# Patient Record
Sex: Female | Born: 1965
Health system: Southern US, Community
[De-identification: ages and names within clinical notes are randomized; demographics above are authoritative.]

## PROBLEM LIST (undated history)

## (undated) HISTORY — PX: TUBAL LIGATION: SHX77

## (undated) HISTORY — PX: ENDOMETRIAL ABLATION: SHX621

---

## 1999-08-26 ENCOUNTER — Other Ambulatory Visit: Admission: RE | Admit: 1999-08-26 | Discharge: 1999-08-26 | Payer: Self-pay | Admitting: Obstetrics and Gynecology

## 2001-11-20 ENCOUNTER — Other Ambulatory Visit: Admission: RE | Admit: 2001-11-20 | Discharge: 2001-11-20 | Payer: Self-pay | Admitting: Obstetrics and Gynecology

## 2002-11-15 ENCOUNTER — Other Ambulatory Visit: Admission: RE | Admit: 2002-11-15 | Discharge: 2002-11-15 | Payer: Self-pay | Admitting: Obstetrics and Gynecology

## 2006-01-06 ENCOUNTER — Other Ambulatory Visit: Admission: RE | Admit: 2006-01-06 | Discharge: 2006-01-06 | Payer: Self-pay | Admitting: Obstetrics and Gynecology

## 2006-02-04 ENCOUNTER — Encounter: Admission: RE | Admit: 2006-02-04 | Discharge: 2006-02-04 | Payer: Self-pay | Admitting: Obstetrics and Gynecology

## 2006-02-24 ENCOUNTER — Encounter: Admission: RE | Admit: 2006-02-24 | Discharge: 2006-02-24 | Payer: Self-pay | Admitting: Obstetrics and Gynecology

## 2007-05-04 ENCOUNTER — Encounter: Admission: RE | Admit: 2007-05-04 | Discharge: 2007-05-04 | Payer: Self-pay | Admitting: Obstetrics and Gynecology

## 2007-05-12 ENCOUNTER — Encounter: Admission: RE | Admit: 2007-05-12 | Discharge: 2007-05-12 | Payer: Self-pay | Admitting: Obstetrics and Gynecology

## 2008-11-11 ENCOUNTER — Other Ambulatory Visit: Admission: RE | Admit: 2008-11-11 | Discharge: 2008-11-11 | Payer: Self-pay | Admitting: Obstetrics and Gynecology

## 2008-11-18 ENCOUNTER — Encounter: Admission: RE | Admit: 2008-11-18 | Discharge: 2008-11-18 | Payer: Self-pay | Admitting: Obstetrics and Gynecology

## 2010-04-28 ENCOUNTER — Encounter: Admission: RE | Admit: 2010-04-28 | Discharge: 2010-04-28 | Payer: Self-pay | Admitting: Obstetrics and Gynecology

## 2011-04-28 ENCOUNTER — Other Ambulatory Visit: Payer: Self-pay | Admitting: Obstetrics and Gynecology

## 2011-04-28 DIAGNOSIS — Z1231 Encounter for screening mammogram for malignant neoplasm of breast: Secondary | ICD-10-CM

## 2011-05-13 ENCOUNTER — Ambulatory Visit
Admission: RE | Admit: 2011-05-13 | Discharge: 2011-05-13 | Disposition: A | Discharge status: Home / Self Care | Payer: BC Managed Care – PPO | Source: Ambulatory Visit | Attending: Obstetrics and Gynecology | Admitting: Obstetrics and Gynecology

## 2011-05-13 DIAGNOSIS — Z1231 Encounter for screening mammogram for malignant neoplasm of breast: Secondary | ICD-10-CM

## 2015-02-04 ENCOUNTER — Other Ambulatory Visit: Payer: Self-pay

## 2015-02-04 DIAGNOSIS — Z1231 Encounter for screening mammogram for malignant neoplasm of breast: Secondary | ICD-10-CM

## 2015-02-18 ENCOUNTER — Ambulatory Visit
Admission: RE | Admit: 2015-02-18 | Discharge: 2015-02-18 | Disposition: A | Discharge status: Home / Self Care | Payer: BLUE CROSS/BLUE SHIELD | Source: Ambulatory Visit

## 2015-02-18 DIAGNOSIS — Z1231 Encounter for screening mammogram for malignant neoplasm of breast: Secondary | ICD-10-CM

## 2015-02-19 ENCOUNTER — Other Ambulatory Visit: Payer: Self-pay | Admitting: Nurse Practitioner

## 2015-02-19 DIAGNOSIS — R928 Other abnormal and inconclusive findings on diagnostic imaging of breast: Secondary | ICD-10-CM

## 2015-02-21 ENCOUNTER — Ambulatory Visit
Admission: RE | Admit: 2015-02-21 | Discharge: 2015-02-21 | Disposition: A | Discharge status: Home / Self Care | Payer: BLUE CROSS/BLUE SHIELD | Source: Ambulatory Visit | Attending: Nurse Practitioner | Admitting: Nurse Practitioner

## 2015-02-21 DIAGNOSIS — R928 Other abnormal and inconclusive findings on diagnostic imaging of breast: Secondary | ICD-10-CM

## 2015-02-26 ENCOUNTER — Other Ambulatory Visit: Payer: BLUE CROSS/BLUE SHIELD

## 2015-07-29 ENCOUNTER — Ambulatory Visit (INDEPENDENT_AMBULATORY_CARE_PROVIDER_SITE_OTHER): Payer: BLUE CROSS/BLUE SHIELD | Admitting: Certified Nurse Midwife

## 2015-07-29 ENCOUNTER — Encounter: Payer: Self-pay | Admitting: Certified Nurse Midwife

## 2015-07-29 VITALS — BP 110/78 | HR 70 | Resp 16 | Ht 66.5 in | Wt 141.0 lb

## 2015-07-29 DIAGNOSIS — Z01419 Encounter for gynecological examination (general) (routine) without abnormal findings: Secondary | ICD-10-CM | POA: Diagnosis not present

## 2015-07-29 DIAGNOSIS — Z124 Encounter for screening for malignant neoplasm of cervix: Secondary | ICD-10-CM

## 2015-07-29 DIAGNOSIS — Z23 Encounter for immunization: Secondary | ICD-10-CM | POA: Diagnosis not present

## 2015-07-29 DIAGNOSIS — Z1211 Encounter for screening for malignant neoplasm of colon: Secondary | ICD-10-CM | POA: Diagnosis not present

## 2015-07-29 DIAGNOSIS — Z0000 Encounter for general adult medical examination without abnormal findings: Secondary | ICD-10-CM

## 2015-07-29 LAB — POCT URINALYSIS DIPSTICK
Bilirubin, UA: NEGATIVE
Blood, UA: NEGATIVE
Glucose, UA: NEGATIVE
Ketones, UA: NEGATIVE
Leukocytes, UA: NEGATIVE
Nitrite, UA: NEGATIVE
Protein, UA: NEGATIVE
Urobilinogen, UA: NEGATIVE
pH, UA: 5

## 2015-07-29 NOTE — Patient Instructions (Signed)
EXERCISE AND DIET:  We recommended that you start or continue a regular exercise program for good health. Regular exercise means any activity that makes your heart beat faster and makes you sweat.  We recommend exercising at least 30 minutes per day at least 3 days a week, preferably 4 or 5.  We also recommend a diet low in fat and sugar.  Inactivity, poor dietary choices and obesity can cause diabetes, heart attack, stroke, and kidney damage, among others.    ALCOHOL AND SMOKING:  Women should limit their alcohol intake to no more than 7 drinks/beers/glasses of wine (combined, not each!) per week. Moderation of alcohol intake to this level decreases your risk of breast cancer and liver damage. And of course, no recreational drugs are part of a healthy lifestyle.  And absolutely no smoking or even second hand smoke. Most people know smoking can cause heart and lung diseases, but did you know it also contributes to weakening of your bones? Aging of your skin?  Yellowing of your teeth and nails?  CALCIUM AND VITAMIN D:  Adequate intake of calcium and Vitamin D are recommended.  The recommendations for exact amounts of these supplements seem to change often, but generally speaking 600 mg of calcium (either carbonate or citrate) and 800 units of Vitamin D per day seems prudent. Certain women may benefit from higher intake of Vitamin D.  If you are among these women, your doctor will have told you during your visit.    PAP SMEARS:  Pap smears, to check for cervical cancer or precancers,  have traditionally been done yearly, although recent scientific advances have shown that most women can have pap smears less often.  However, every woman still should have a physical exam from her gynecologist every year. It will include a breast check, inspection of the vulva and vagina to check for abnormal growths or skin changes, a visual exam of the cervix, and then an exam to evaluate the size and shape of the uterus and  ovaries.  And after 51 years of age, a rectal exam is indicated to check for rectal cancers. We will also provide age appropriate advice regarding health maintenance, like when you should have certain vaccines, screening for sexually transmitted diseases, bone density testing, colonoscopy, mammograms, etc.   MAMMOGRAMS:  All women over 40 years old should have a yearly mammogram. Many facilities now offer a "3D" mammogram, which may cost around $50 extra out of pocket. If possible,  we recommend you accept the option to have the 3D mammogram performed.  It both reduces the number of women who will be called back for extra views which then turn out to be normal, and it is better than the routine mammogram at detecting truly abnormal areas.    COLONOSCOPY:  Colonoscopy to screen for colon cancer is recommended for all women at age 50.  We know, you hate the idea of the prep.  We agree, BUT, having colon cancer and not knowing it is worse!!  Colon cancer so often starts as a polyp that can be seen and removed at colonscopy, which can quite literally save your life!  And if your first colonoscopy is normal and you have no family history of colon cancer, most women don't have to have it again for 10 years.  Once every ten years, you can do something that may end up saving your life, right?  We will be happy to help you get it scheduled when you are ready.    Be sure to check your insurance coverage so you understand how much it will cost.  It may be covered as a preventative service at no cost, but you should check your particular policy.     Leg Cramps Leg cramps that occur during exercise can be caused by poor circulation or dehydration. However, muscle cramps that occur at rest or during the night are usually not due to any serious medical problem. Heat cramps may cause muscle spasms during hot weather.  CAUSES There is no clear cause for muscle cramps. However, dehydration may be a factor for those who do not  drink enough fluids and those who exercise in the heat. Imbalances in the level of sodium, potassium, calcium or magnesium in the muscle tissue may also be a factor. Some medications, such as water pills (diuretics), may cause loss of chemicals that the body needs (like sodium and potassium) and cause muscle cramps. TREATMENT   Make sure your diet has enough fluids and essential minerals for the muscle to work normally.  Avoid strenuous exercise for several days if you have been having frequent leg cramps.  Stretch and massage the cramped muscle for several minutes.  Some medicines may be helpful in some patients with night cramps. Only take over-the-counter or prescription medicines as directed by your caregiver. SEEK IMMEDIATE MEDICAL CARE IF:   Your leg cramps become worse.  Your foot becomes cold, numb, or blue. Document Released: 01/20/2005 Document Revised: 03/06/2012 Document Reviewed: 01/07/2009 [facility] Patient Information 2015 [place], [place]. This information is not intended to replace advice given to you by your health care provider. Make sure you discuss any questions you have with your health care provider.  Massage, heat to area or socks.   Great to meet you today. Have a great summer. Debbi

## 2015-07-29 NOTE — Progress Notes (Signed)
49 y.o. G31P2004 Divorced  Caucasian Fe here to re-establish care and  for annual exam.  No menses due to ablation for menorrhagia. Denies hot flashes, night sweats or concerns. No STD screening needed.Patient is using Urgent care if needed. Patient complaining of cramping of feet and toes at hs, sometimes last longer, usually resolves with walking or massage or rest. Does wear heels all day and very little calcium in diet. No history of leg or circulation issues. Desires screening labs today.  No LMP recorded. Patient has had an ablation.  LMP 2007       Sexually active: Yes.    The current method of family planning is tubal ligation.    Exercising: No.  exercise Smoker:  no  Health Maintenance: Pap:  Yrs ago, no abnormal pap smears MMG: 02-18-15 category c,, 02-21-15 birads 2:neg. Recheck of right breast cyst, has decreased in size. Colonoscopy:  none BMD:   none TDaP:  unsure Labs: poct urine-neg Self breast exam: done monthly   reports that she has been smoking.  She does not have any smokeless tobacco history on file. She reports that she drinks about 4.2 oz of alcohol per week. She reports that she does not use illicit drugs.  History reviewed. No pertinent past medical history.  Past Surgical History  Procedure Laterality Date  . Endometrial ablation    . Tubal ligation      No current outpatient prescriptions on file.   No current facility-administered medications for this visit.    Family History  Problem Relation Age of Onset  . Breast cancer Mother   . Heart attack Father   . Heart attack Maternal Grandmother   . Breast cancer Paternal Grandmother   . Diabetes Paternal Grandmother     ROS:  Pertinent items are noted in HPI.  Otherwise, a comprehensive ROS was negative.  Exam:   BP 110/78 mmHg  Pulse 70  Resp 16  Ht 5' 6.5" (1.689 m)  Wt 141 lb (63.957 kg)  BMI 22.42 kg/m2 Height: 5' 6.5" (168.9 cm) Ht Readings from Last 3 Encounters:  07/29/15 5' 6.5" (1.689  m)    General appearance: alert, cooperative and appears stated age Head: Normocephalic, without obvious abnormality, atraumatic Neck: no adenopathy, supple, symmetrical, trachea midline and thyroid normal to inspection and palpation Lungs: clear to auscultation bilaterally Breasts: normal appearance, no masses or tenderness, No nipple retraction or dimpling, No nipple discharge or bleeding, No axillary or supraclavicular adenopathy Heart: regular rate and rhythm Abdomen: soft, non-tender; no masses,  no organomegaly Extremities: extremities normal, atraumatic, no cyanosis or edema Skin: Skin color, texture, turgor normal. No rashes or lesions Lymph nodes: Cervical, supraclavicular, and axillary nodes normal. No abnormal inguinal nodes palpated Neurologic: Grossly normal   Pelvic: External genitalia:  no lesions              Urethra:  normal appearing urethra with no masses, tenderness or lesions              Bartholin's and Skene's: normal                 Vagina: normal appearing vagina with normal color and discharge, no lesions              Cervix: normal,non tender,no lesions              Pap taken: Yes.   Bimanual Exam:  Uterus:  normal size, contour, position, consistency, mobility, non-tender  Adnexa: normal adnexa and no mass, fullness, tenderness               Rectovaginal: Confirms               Anus:  normal sphincter tone, no lesions  Chaperone present: Yes  A:  Well Woman with normal exam  Contraception BTL  Amenorrhea due to ablation history for menorrhagia  Family history of Breast cancer mother(50's) ? Genetic screening done, and paternal grandmother( older age onset)  Recent mammogram for follow up of cyst which has decreased in size and now on yearly follow up  Leg and feet cramping  Screening labs  Colonoscopy due  Immunization due  P:   Reviewed health and wellness pertinent to exam  Discussed perimenopausal changes they may occur at her age  and etiology. Expectations discussed and need to advise if concerns  Discussed genetic screening option with family history of breast cancer. Patient feels mother had this done. Will check with her and advise. Stressed SBE and mammogram yearly with 3D screening.  Discussed calcium and magnesium balance which can cause cramping. Heels with continued pressure on ball of foot can also cause this. Work on diet if deficient. Try socks at hs also. Handout given. Advise if not resolving.  Labs: CBC, CMP, Lipid panel, TSH, Vitamin D  Discussed risks and benefits of colonoscopy. Desires referral for. Patient aware she will be called with appointment information.  Requests TDAP  Pap smear taken with HPVHR   counseled on breast self exam, mammography screening, adequate intake of calcium and vitamin D, diet and exercise,   return annually or prn  An After Visit Summary was printed and given to the patient.

## 2015-07-30 ENCOUNTER — Telehealth: Payer: Self-pay | Admitting: Certified Nurse Midwife

## 2015-07-30 LAB — COMPREHENSIVE METABOLIC PANEL
ALT: 21 U/L (ref 6–29)
AST: 19 U/L (ref 10–35)
Albumin: 4.2 g/dL (ref 3.6–5.1)
Alkaline Phosphatase: 45 U/L (ref 33–130)
BUN: 20 mg/dL (ref 7–25)
CO2: 28 mmol/L (ref 20–31)
Calcium: 9.7 mg/dL (ref 8.6–10.4)
Chloride: 101 mmol/L (ref 98–110)
Creat: 0.86 mg/dL (ref 0.50–1.05)
GLUCOSE: 51 mg/dL — AB (ref 65–99)
Potassium: 4.3 mmol/L (ref 3.5–5.3)
SODIUM: 141 mmol/L (ref 135–146)
TOTAL PROTEIN: 7.2 g/dL (ref 6.1–8.1)
Total Bilirubin: 0.5 mg/dL (ref 0.2–1.2)

## 2015-07-30 LAB — CBC
HEMATOCRIT: 45.2 % (ref 36.0–46.0)
HEMOGLOBIN: 15 g/dL (ref 12.0–15.0)
MCH: 32 pg (ref 26.0–34.0)
MCHC: 33.2 g/dL (ref 30.0–36.0)
MCV: 96.4 fL (ref 78.0–100.0)
MPV: 11 fL (ref 8.6–12.4)
PLATELETS: 266 10*3/uL (ref 150–400)
RBC: 4.69 MIL/uL (ref 3.87–5.11)
RDW: 13.5 % (ref 11.5–15.5)
WBC: 9.1 10*3/uL (ref 4.0–10.5)

## 2015-07-30 LAB — VITAMIN D 25 HYDROXY (VIT D DEFICIENCY, FRACTURES): VIT D 25 HYDROXY: 32 ng/mL (ref 30–100)

## 2015-07-30 LAB — LIPID PANEL
Cholesterol: 216 mg/dL — ABNORMAL HIGH (ref 125–200)
HDL: 108 mg/dL (ref >=46)
LDL CALC: 95 mg/dL (ref <130)
Total CHOL/HDL Ratio: 2 Ratio (ref <=5.0)
Triglycerides: 66 mg/dL (ref <150)
VLDL: 13 mg/dL (ref <30)

## 2015-07-30 LAB — TSH: TSH: 3.471 u[IU]/mL (ref 0.350–4.500)

## 2015-07-30 NOTE — Progress Notes (Signed)
Reviewed personally.  M. Suzanne Jeanell Mangan, MD.  

## 2015-07-30 NOTE — Telephone Encounter (Signed)
Notes Recorded by Eliezer Bottom, CMA on 07/30/2015 at 10:20 AM Message giving results left on patients voicemail. Release signed [phone] (Mobile) Notes Recorded by Verner Chol, CNM on 07/30/2015 at 7:57 AM Notify Vitamin D, TSH, CBC are Normal Lipid panel all areas normal just borderline cholesterol only HDL is great Liver,kidney profile is normal, glucose low, but were fasting. Be sure and eat breakfast daily!   Left message to call Evo Aderman at [phone].

## 2015-07-30 NOTE — Telephone Encounter (Signed)
Patient is asking for recent labs results. Patient says she received a message earlier today but could not understand. No open tc note. Last seen 07/29/15.

## 2015-07-31 LAB — IPS PAP TEST WITH HPV

## 2015-08-04 NOTE — Telephone Encounter (Signed)
Late entry.Spoke with patient on 07/30/2015. Results given as seen below. Patient agreeable and verbalized understanding.  Routing to provider for final review. Patient agreeable to disposition. Will close encounter.   Patient aware provider will review message and nurse will return call if any additional advice or change of disposition.

## 2016-06-16 ENCOUNTER — Other Ambulatory Visit: Payer: Self-pay | Admitting: Certified Nurse Midwife

## 2016-06-16 DIAGNOSIS — Z1231 Encounter for screening mammogram for malignant neoplasm of breast: Secondary | ICD-10-CM

## 2016-06-25 ENCOUNTER — Ambulatory Visit
Admission: RE | Admit: 2016-06-25 | Discharge: 2016-06-25 | Disposition: A | Discharge status: Home / Self Care | Payer: BLUE CROSS/BLUE SHIELD | Source: Ambulatory Visit | Attending: Certified Nurse Midwife | Admitting: Certified Nurse Midwife

## 2016-06-25 DIAGNOSIS — Z1231 Encounter for screening mammogram for malignant neoplasm of breast: Secondary | ICD-10-CM

## 2016-08-03 ENCOUNTER — Ambulatory Visit: Payer: BLUE CROSS/BLUE SHIELD | Admitting: Certified Nurse Midwife

## 2016-09-03 ENCOUNTER — Ambulatory Visit: Payer: BLUE CROSS/BLUE SHIELD | Admitting: Certified Nurse Midwife

## 2016-09-09 ENCOUNTER — Ambulatory Visit (INDEPENDENT_AMBULATORY_CARE_PROVIDER_SITE_OTHER): Payer: BLUE CROSS/BLUE SHIELD | Admitting: Certified Nurse Midwife

## 2016-09-09 ENCOUNTER — Encounter: Payer: Self-pay | Admitting: Certified Nurse Midwife

## 2016-09-09 VITALS — BP 100/68 | HR 70 | Resp 16 | Ht 66.5 in | Wt 133.0 lb

## 2016-09-09 DIAGNOSIS — Z01419 Encounter for gynecological examination (general) (routine) without abnormal findings: Secondary | ICD-10-CM

## 2016-09-09 DIAGNOSIS — Z0000 Encounter for general adult medical examination without abnormal findings: Secondary | ICD-10-CM | POA: Diagnosis not present

## 2016-09-09 DIAGNOSIS — R6882 Decreased libido: Secondary | ICD-10-CM

## 2016-09-09 LAB — LIPID PANEL
CHOLESTEROL: 202 mg/dL — AB (ref 125–200)
HDL: 100 mg/dL (ref >=46)
LDL Cholesterol: 91 mg/dL (ref <130)
Total CHOL/HDL Ratio: 2 Ratio (ref <=5.0)
Triglycerides: 54 mg/dL (ref <150)
VLDL: 11 mg/dL (ref <30)

## 2016-09-09 LAB — TSH: TSH: 3.43 m[IU]/L

## 2016-09-09 NOTE — Progress Notes (Signed)
50 y.o. [id number] Divorced  Caucasian Fe here for annual exam. Denies vaginal bleeding with ablation. Denies hot flashes and night sweats. Has noted change in libido and has decreased. She feels some of this could be fatigue. Partner is 46 and concerned. Denies vaginal dryness or pain with intercourse. No vaginal changes that she is aware of . Sees Urgent care if needed. Screening labs if needed. No health issues today[place]  No LMP recorded. Patient has had an ablation.          Sexually active: Yes.    The current method of family planning is tubal ligation.    Exercising: No.  exercise Smoker:  no  Health Maintenance: Pap:  07-29-15 neg HPV HR neg MMG:  06-25-16 category c density birads 1:neg Colonoscopy:  9/16 neg f/u 5761yrs BMD:   none TDaP:  2016 Shingles: no Pneumonia: no Hep C and HIV: not done Labs: none Self breast exam: done in shower   reports that she has quit smoking. She has never used smokeless tobacco. She reports that she drinks about 2.4 - 3.0 oz of alcohol per week . She reports that she does not use drugs.  History reviewed. No pertinent past medical history.  Past Surgical History:  Procedure Laterality Date  . ENDOMETRIAL ABLATION    . TUBAL LIGATION      No current outpatient prescriptions on file.   No current facility-administered medications for this visit.     Family History  Problem Relation Age of Onset  . Breast cancer Mother   . Heart attack Father   . Heart attack Maternal Grandmother   . Breast cancer Paternal Grandmother   . Diabetes Paternal Grandmother   . Heart attack Paternal Grandfather     ROS:  Pertinent items are noted in HPI.  Otherwise, a comprehensive ROS was negative.  Exam:   BP 100/68   Pulse 70   Resp 16   Ht 5' 6.5" (1.689 m)   Wt 133 lb (60.3 kg)   BMI 21.15 kg/m  Height: 5' 6.5" (168.9 cm) Ht Readings from Last 3 Encounters:  09/09/16 5' 6.5" (1.689 m)  07/29/15 5' 6.5" (1.689 m)    General  appearance: alert, cooperative and appears stated age Head: Normocephalic, without obvious abnormality, atraumatic Neck: no adenopathy, supple, symmetrical, trachea midline and thyroid normal to inspection and palpation Lungs: clear to auscultation bilaterally Breasts: normal appearance, no masses or tenderness, No nipple retraction or dimpling, No nipple discharge or bleeding, No axillary or supraclavicular adenopathy Heart: regular rate and rhythm Abdomen: soft, non-tender; no masses,  no organomegaly Extremities: extremities normal, atraumatic, no cyanosis or edema Skin: Skin color, texture, turgor normal. No rashes or lesions Lymph nodes: Cervical, supraclavicular, and axillary nodes normal. No abnormal inguinal nodes palpated Neurologic: Grossly normal   Pelvic: External genitalia:  no lesions              Urethra:  normal appearing urethra with no masses, tenderness or lesions              Bartholin's and Skene's: normal                 Vagina: normal appearing vagina with normal color and discharge, no lesions              Cervix: no cervical motion tenderness and no lesions              Pap taken: No. Bimanual Exam:  Uterus:  normal size, contour, position, consistency, mobility, non-tender              Adnexa: normal adnexa and no mass, fullness, tenderness               Rectovaginal: Confirms               Anus:  normal sphincter tone, no lesions  Chaperone present: yes  A:  Well Woman with normal exam  Perimenopausal with decrease libido  Screening labs  P:   Reviewed health and wellness pertinent to exam  Discussed perimenopause/menopause and etiology, changes and expectations. Discussed libido change with age and hormonal age at times, no physical changes noted on exam. Will also do labs and advise. Questions addressed. Handout given . Discussed planning date night to help with time together for sexual activity.   Labs Lipid panel, TSH,FSH,Vitamin D, testosterone  Pap  smear as above not taken   counseled on breast self exam, mammography screening, adequate intake of calcium and vitamin D, diet and exercise  return annually or prn  An After Visit Summary was printed and given to the patient.

## 2016-09-09 NOTE — Patient Instructions (Signed)

## 2016-09-10 ENCOUNTER — Telehealth: Payer: Self-pay | Admitting: Certified Nurse Midwife

## 2016-09-10 LAB — FOLLICLE STIMULATING HORMONE: FSH: 127 m[IU]/mL — AB

## 2016-09-10 LAB — VITAMIN D 25 HYDROXY (VIT D DEFICIENCY, FRACTURES): Vit D, 25-Hydroxy: 36 ng/mL (ref 30–100)

## 2016-09-10 LAB — TESTOSTERONE, TOTAL, LC/MS/MS: TESTOSTERONE, TOTAL, LC-MS-MS: 25 ng/dL (ref 2–45)

## 2016-09-10 NOTE — Telephone Encounter (Signed)
Opened In error

## 2016-09-12 NOTE — Progress Notes (Signed)
Encounter reviewed Jill Jertson, MD   

## 2016-09-21 ENCOUNTER — Ambulatory Visit (INDEPENDENT_AMBULATORY_CARE_PROVIDER_SITE_OTHER): Payer: BLUE CROSS/BLUE SHIELD | Admitting: Certified Nurse Midwife

## 2016-09-21 ENCOUNTER — Encounter: Payer: Self-pay | Admitting: Certified Nurse Midwife

## 2016-09-21 VITALS — BP 116/72 | HR 76 | Resp 16 | Ht 67.0 in | Wt 133.4 lb

## 2016-09-21 DIAGNOSIS — N951 Menopausal and female climacteric states: Secondary | ICD-10-CM

## 2016-09-21 NOTE — Patient Instructions (Signed)

## 2016-09-21 NOTE — Progress Notes (Addendum)
50 y.o.  [id number] Divorced Caucasian female here to discuss intitation of HRT.  LMP: No LMP recorded. Patient has had an ablation..  Patient reports she's having moodiness, no energy. These have been present for 2 months. Some decrease in libido but she feels related to working late hours and having partner's daughter there every weekend. "Just no time for us" Partner is also younger than patient.  Has read about HRT with menopausal symptoms, but not really having hot flashes or night sweats and no insomnia.   History reviewed. No pertinent past medical history.  Heart disease:  No. DVT history:  No. Breast Cancer:  No.  (mother had breast cancer after menopause)  Last Mammogram: 06/30/16 negative  Last AEX:  09/09/16 Last Pap smear:  07/29/15 negative  The patient has a family history of heart attacks and breast cancer.  Discussed with patient risks and benefits and specifically the WHI study including but not limited to risks of increased risks of heart disease, MI, stroke, DVT, and breast cancer.  Increased risks of gall bladder disease and change in cholesterol panels also discussed.  Possibility of bleeding was discussed as patient does have a uterus.  Benefits of improved quality of life, improved bone density and decreased risks of colon cancer also discussed.    Recent lab work:  FSH: 127.0        TSH: 3.43         AMH:  contraception BTL  Assessment:  Symptomatic menopausal symptoms only mood changes and some fatigue. ? Related to social situation.   Plan:  Discussed with patients her concerns and suggestions given for increasing libido. Discussed being open with partner about time of day for time together and change with daughter visits possible.  Patient does not want to start on HRT at this point to her family history of breast cancer. Given handout on menopause and comfort measures if other menopausal symptoms occur. Questions addressed at length. Patient will advise if concerns.  28   minutes spent with patient >50% of time was in face to face discussion of menopause and management of symptoms.

## 2016-09-24 NOTE — Progress Notes (Signed)
Encounter reviewed [name], MD   

## 2016-09-26 NOTE — Addendum Note (Signed)
Addended by: Verner CholLEONARD, DEBORAH S on: 09/26/2016 01:37 PM   Modules accepted: Orders

## 2017-09-13 ENCOUNTER — Ambulatory Visit: Payer: BLUE CROSS/BLUE SHIELD | Admitting: Certified Nurse Midwife

## 2017-09-19 ENCOUNTER — Other Ambulatory Visit: Payer: Self-pay | Admitting: Certified Nurse Midwife

## 2017-09-19 DIAGNOSIS — Z1231 Encounter for screening mammogram for malignant neoplasm of breast: Secondary | ICD-10-CM

## 2017-10-03 ENCOUNTER — Ambulatory Visit
Admission: RE | Admit: 2017-10-03 | Discharge: 2017-10-03 | Disposition: A | Discharge status: Home / Self Care | Payer: BLUE CROSS/BLUE SHIELD | Source: Ambulatory Visit | Attending: Certified Nurse Midwife | Admitting: Certified Nurse Midwife

## 2017-10-03 DIAGNOSIS — Z1231 Encounter for screening mammogram for malignant neoplasm of breast: Secondary | ICD-10-CM

## 2017-10-06 ENCOUNTER — Ambulatory Visit: Payer: BLUE CROSS/BLUE SHIELD | Admitting: Certified Nurse Midwife

## 2017-11-15 ENCOUNTER — Ambulatory Visit: Payer: BLUE CROSS/BLUE SHIELD | Admitting: Certified Nurse Midwife

## 2018-01-10 ENCOUNTER — Other Ambulatory Visit: Payer: Self-pay

## 2018-01-10 ENCOUNTER — Other Ambulatory Visit (HOSPITAL_COMMUNITY)
Admission: RE | Admit: 2018-01-10 | Discharge: 2018-01-10 | Disposition: A | Discharge status: Home / Self Care | Payer: BLUE CROSS/BLUE SHIELD | Source: Ambulatory Visit | Attending: Obstetrics & Gynecology | Admitting: Obstetrics & Gynecology

## 2018-01-10 ENCOUNTER — Encounter: Payer: Self-pay | Admitting: Certified Nurse Midwife

## 2018-01-10 ENCOUNTER — Ambulatory Visit (INDEPENDENT_AMBULATORY_CARE_PROVIDER_SITE_OTHER): Payer: BLUE CROSS/BLUE SHIELD | Admitting: Certified Nurse Midwife

## 2018-01-10 VITALS — BP 102/68 | HR 58 | Resp 14 | Ht 66.5 in | Wt 150.5 lb

## 2018-01-10 DIAGNOSIS — Z124 Encounter for screening for malignant neoplasm of cervix: Secondary | ICD-10-CM

## 2018-01-10 DIAGNOSIS — N6310 Unspecified lump in the right breast, unspecified quadrant: Secondary | ICD-10-CM

## 2018-01-10 DIAGNOSIS — N951 Menopausal and female climacteric states: Secondary | ICD-10-CM

## 2018-01-10 DIAGNOSIS — Z01419 Encounter for gynecological examination (general) (routine) without abnormal findings: Secondary | ICD-10-CM | POA: Diagnosis not present

## 2018-01-10 DIAGNOSIS — Z0000 Encounter for general adult medical examination without abnormal findings: Secondary | ICD-10-CM | POA: Diagnosis not present

## 2018-01-10 NOTE — Patient Instructions (Signed)
EXERCISE AND DIET:  We recommended that you start or continue a regular exercise program for good health. Regular exercise means any activity that makes your heart beat faster and makes you sweat.  We recommend exercising at least 30 minutes per day at least 3 days a week, preferably 4 or 5.  We also recommend a diet low in fat and sugar.  Inactivity, poor dietary choices and obesity can cause diabetes, heart attack, stroke, and kidney damage, among others.    ALCOHOL AND SMOKING:  Women should limit their alcohol intake to no more than 7 drinks/beers/glasses of wine (combined, not each!) per week. Moderation of alcohol intake to this level decreases your risk of breast cancer and liver damage. And of course, no recreational drugs are part of a healthy lifestyle.  And absolutely no smoking or even second hand smoke. Most people know smoking can cause heart and lung diseases, but did you know it also contributes to weakening of your bones? Aging of your skin?  Yellowing of your teeth and nails?  CALCIUM AND VITAMIN D:  Adequate intake of calcium and Vitamin D are recommended.  The recommendations for exact amounts of these supplements seem to change often, but generally speaking 600 mg of calcium (either carbonate or citrate) and 800 units of Vitamin D per day seems prudent. Certain women may benefit from higher intake of Vitamin D.  If you are among these women, your doctor will have told you during your visit.    PAP SMEARS:  Pap smears, to check for cervical cancer or precancers,  have traditionally been done yearly, although recent scientific advances have shown that most women can have pap smears less often.  However, every woman still should have a physical exam from her gynecologist every year. It will include a breast check, inspection of the vulva and vagina to check for abnormal growths or skin changes, a visual exam of the cervix, and then an exam to evaluate the size and shape of the uterus and  ovaries.  And after 54 years of age, a rectal exam is indicated to check for rectal cancers. We will also provide age appropriate advice regarding health maintenance, like when you should have certain vaccines, screening for sexually transmitted diseases, bone density testing, colonoscopy, mammograms, etc.   MAMMOGRAMS:  All women over 40 years old should have a yearly mammogram. Many facilities now offer a "3D" mammogram, which may cost around $50 extra out of pocket. If possible,  we recommend you accept the option to have the 3D mammogram performed.  It both reduces the number of women who will be called back for extra views which then turn out to be normal, and it is better than the routine mammogram at detecting truly abnormal areas.    COLONOSCOPY:  Colonoscopy to screen for colon cancer is recommended for all women at age 50.  We know, you hate the idea of the prep.  We agree, BUT, having colon cancer and not knowing it is worse!!  Colon cancer so often starts as a polyp that can be seen and removed at colonscopy, which can quite literally save your life!  And if your first colonoscopy is normal and you have no family history of colon cancer, most women don't have to have it again for 10 years.  Once every ten years, you can do something that may end up saving your life, right?  We will be happy to help you get it scheduled when you are ready.    Be sure to check your insurance coverage so you understand how much it will cost.  It may be covered as a preventative service at no cost, but you should check your particular policy.      Exercising to Lose Weight Exercising can help you to lose weight. In order to lose weight through exercise, you need to do vigorous-intensity exercise. You can tell that you are exercising with vigorous intensity if you are breathing very hard and fast and cannot hold a conversation while exercising. Moderate-intensity exercise helps to maintain your current weight. You can  tell that you are exercising at a moderate level if you have a higher heart rate and faster breathing, but you are still able to hold a conversation. How often should I exercise? Choose an activity that you enjoy and set realistic goals. Your health care provider can help you to make an activity plan that works for you. Exercise regularly as directed by your health care provider. This may include:  Doing resistance training twice each week, such as: ? Push-ups. ? Sit-ups. ? Lifting weights. ? Using resistance bands.  Doing a given intensity of exercise for a given amount of time. Choose from these options: ? 150 minutes of moderate-intensity exercise every week. ? 75 minutes of vigorous-intensity exercise every week. ? A mix of moderate-intensity and vigorous-intensity exercise every week.  Children, pregnant women, people who are out of shape, people who are overweight, and older adults may need to consult a health care provider for individual recommendations. If you have any sort of medical condition, be sure to consult your health care provider before starting a new exercise program. What are some activities that can help me to lose weight?  Walking at a rate of at least 4.5 miles an hour.  Jogging or running at a rate of 5 miles per hour.  Biking at a rate of at least 10 miles per hour.  Lap swimming.  Roller-skating or in-line skating.  Cross-country skiing.  Vigorous competitive sports, such as football, basketball, and soccer.  Jumping rope.  Aerobic dancing. How can I be more active in my day-to-day activities?  Use the stairs instead of the elevator.  Take a walk during your lunch break.  If you drive, park your car farther away from work or school.  If you take public transportation, get off one stop early and walk the rest of the way.  Make all of your phone calls while standing up and walking around.  Get up, stretch, and walk around every 30 minutes  throughout the day. What guidelines should I follow while exercising?  Do not exercise so much that you hurt yourself, feel dizzy, or get very short of breath.  Consult your health care provider prior to starting a new exercise program.  Wear comfortable clothes and shoes with good support.  Drink plenty of water while you exercise to prevent dehydration or heat stroke. Body water is lost during exercise and must be replaced.  Work out until you breathe faster and your heart beats faster. This information is not intended to replace advice given to you by your health care provider. Make sure you discuss any questions you have with your health care provider. Document Released: 01/15/2011 Document Revised: 05/20/2016 Document Reviewed: 05/16/2014 Elsevier Interactive Patient Education  2018 [employer] 

## 2018-01-10 NOTE — Progress Notes (Signed)
Scheduled patient while in office for right diagnostic [facility]13[facility]2018 at 9:30 am. Patient is agreeable to date and time. Placed in mammogram hold.

## 2018-01-10 NOTE — Progress Notes (Signed)
52 y.o. [id number] Divorced  Caucasian Fe here for annual exam. Periods none due to ablation. Still occasional hot flash, no issues. Denies vaginal dryness. Sexually active no STD concerns or screening. Sees Urgent care if needed. Desires screening labs today. No health issues today.  No LMP recorded. Patient has had an ablation.          Sexually active: Yes.    The current method of family planning is tubal ligation.    Exercising: No.  The patient does not participate in regular exercise at present. Smoker:  Former smoker   Health Maintenance: Pap:  07-29-15 neg HPV HR neg History of Abnormal Pap: no MMG:  10-04-17 category b density birads 1:neg Self Breast exams: yes Colonoscopy:  2016 negative, f/u 10 years  BMD:   none TDaP:  2016 Shingles: no Pneumonia: no Hep C and HIV: not done Labs: discuss with provider    reports that she has quit smoking. she has never used smokeless tobacco. She reports that she drinks about 2.4 - 3.0 oz of alcohol per week. She reports that she does not use drugs.  History reviewed. No pertinent past medical history.  Past Surgical History:  Procedure Laterality Date  . ENDOMETRIAL ABLATION    . TUBAL LIGATION      No current outpatient medications on file.   No current facility-administered medications for this visit.     Family History  Problem Relation Age of Onset  . Breast cancer Mother   . Heart attack Father   . Heart attack Maternal Grandmother   . Breast cancer Paternal Grandmother   . Diabetes Paternal Grandmother   . Heart attack Paternal Grandfather     ROS:  Pertinent items are noted in HPI.  Otherwise, a comprehensive ROS was negative.  Exam:   BP 102/68 (BP Location: Right Arm, Patient Position: Sitting, Cuff Size: Normal)   Pulse (!) 58   Resp 14   Ht 5' 6.5" (1.689 m)   Wt 150 lb 8 oz (68.3 kg)   BMI 23.93 kg/m  Height: 5' 6.5" (168.9 cm) Ht Readings from Last 3 Encounters:  01/10/18 5' 6.5" (1.689 m)  09/21/16 5'  7" (1.702 m)  09/09/16 5' 6.5" (1.689 m)    General appearance: alert, cooperative and appears stated age Head: Normocephalic, without obvious abnormality, atraumatic Neck: no adenopathy, supple, symmetrical, trachea midline and thyroid normal to inspection and palpation Lungs: clear to auscultation bilaterally Breasts: normal appearance, no masses or tenderness, No nipple retraction or dimpling, No nipple discharge or bleeding, No axillary or supraclavicular adenopathy, right breast at 12 o'clock 2-3 fb from aerola pea size mass noted, mobile, non tender, ,?tiny mass noted adjacent to this Heart: regular rate and rhythm Abdomen: soft, non-tender; no masses,  no organomegaly Extremities: extremities normal, atraumatic, no cyanosis or edema Skin: Skin color, texture, turgor normal. No rashes or lesions Lymph nodes: Cervical, supraclavicular, and axillary nodes normal. No abnormal inguinal nodes palpated Neurologic: Grossly normal   Pelvic: External genitalia:  no lesions              Urethra:  normal appearing urethra with no masses, tenderness or lesions              Bartholin's and Skene's: normal                 Vagina: normal appearing vagina with normal color and discharge, no lesions  Cervix: no bleeding following Pap, no cervical motion tenderness and no lesions              Pap taken: Yes.   Bimanual Exam:  Uterus:  normal size, contour, position, consistency, mobility, non-tender              Adnexa: normal adnexa and no mass, fullness, tenderness               Rectovaginal: Confirms               Anus:  normal sphincter tone, no lesions  Chaperone present: yes  A:  Well Woman with normal exam  Menopausal no HRT  Right breast mass  Family history of breast cancer, mother, PGM, and MAunt, not her sister  Screening labs  P:   Reviewed health and wellness pertinent to exam  Aware of need to evaluate if vaginal bleeding  Discussed finding of breast mass and had  patient feel area.  Does SBE, but not routinely.Discussed need for  evaluation with diagnostic mammogram and [place] and results will be discussed with her the day of evaluation.. Questions addressed. Will be scheduled prior to leaving today.  Labs:CMP,Lipid panel, Vitamin D, CBC  Pap smear: yes  counseled on mammography screening, STD prevention, adequate intake of calcium and vitamin D, dietary intake and weight loss with exercise. Encouraged to work on weight loss.  return annually or prn  An After Visit Summary was printed and given to the patient.

## 2018-01-11 LAB — COMPREHENSIVE METABOLIC PANEL
A/G RATIO: 1.6 (ref 1.2–2.2)
ALBUMIN: 4.3 g/dL (ref 3.5–5.5)
ALT: 26 IU/L (ref 0–32)
AST: 24 IU/L (ref 0–40)
Alkaline Phosphatase: 53 IU/L (ref 39–117)
BILIRUBIN TOTAL: 0.3 mg/dL (ref 0.0–1.2)
BUN / CREAT RATIO: 21 (ref 9–23)
BUN: 18 mg/dL (ref 6–24)
CHLORIDE: 102 mmol/L (ref 96–106)
CO2: 23 mmol/L (ref 20–29)
Calcium: 9.6 mg/dL (ref 8.7–10.2)
Creatinine, Ser: 0.85 mg/dL (ref 0.57–1.00)
GFR calc non Af Amer: 78 mL/min/{1.73_m2} (ref >59)
GFR, EST AFRICAN AMERICAN: 90 mL/min/{1.73_m2} (ref >59)
GLOBULIN, TOTAL: 2.7 g/dL (ref 1.5–4.5)
Glucose: 94 mg/dL (ref 65–99)
Potassium: 5.1 mmol/L (ref 3.5–5.2)
SODIUM: 143 mmol/L (ref 134–144)
Total Protein: 7 g/dL (ref 6.0–8.5)

## 2018-01-11 LAB — VITAMIN D 25 HYDROXY (VIT D DEFICIENCY, FRACTURES): VIT D 25 HYDROXY: 19.7 ng/mL — AB (ref 30.0–100.0)

## 2018-01-11 LAB — LIPID PANEL
CHOLESTEROL TOTAL: 223 mg/dL — AB (ref 100–199)
Chol/HDL Ratio: 2.4 ratio (ref 0.0–4.4)
HDL: 92 mg/dL (ref >39)
LDL Calculated: 117 mg/dL — ABNORMAL HIGH (ref 0–99)
TRIGLYCERIDES: 70 mg/dL (ref 0–149)
VLDL CHOLESTEROL CAL: 14 mg/dL (ref 5–40)

## 2018-01-11 LAB — CYTOLOGY - PAP
Diagnosis: NEGATIVE
HPV (WINDOPATH): NOT DETECTED

## 2018-01-11 LAB — CBC
Hematocrit: 41.9 % (ref 34.0–46.6)
Hemoglobin: 13.7 g/dL (ref 11.1–15.9)
MCH: 31.4 pg (ref 26.6–33.0)
MCHC: 32.7 g/dL (ref 31.5–35.7)
MCV: 96 fL (ref 79–97)
PLATELETS: 279 10*3/uL (ref 150–379)
RBC: 4.37 x10E6/uL (ref 3.77–5.28)
RDW: 13.2 % (ref 12.3–15.4)
WBC: 7.3 10*3/uL (ref 3.4–10.8)

## 2018-01-12 ENCOUNTER — Other Ambulatory Visit: Payer: Self-pay

## 2018-01-12 DIAGNOSIS — E559 Vitamin D deficiency, unspecified: Secondary | ICD-10-CM

## 2018-01-13 ENCOUNTER — Ambulatory Visit
Admission: RE | Admit: 2018-01-13 | Discharge: 2018-01-13 | Disposition: A | Discharge status: Home / Self Care | Payer: BLUE CROSS/BLUE SHIELD | Source: Ambulatory Visit | Attending: Certified Nurse Midwife | Admitting: Certified Nurse Midwife

## 2018-01-13 DIAGNOSIS — N6001 Solitary cyst of right breast: Secondary | ICD-10-CM | POA: Diagnosis not present

## 2018-01-13 DIAGNOSIS — N6310 Unspecified lump in the right breast, unspecified quadrant: Secondary | ICD-10-CM

## 2018-01-13 DIAGNOSIS — R928 Other abnormal and inconclusive findings on diagnostic imaging of breast: Secondary | ICD-10-CM | POA: Diagnosis not present

## 2018-01-17 ENCOUNTER — Telehealth: Payer: Self-pay | Admitting: *Deleted

## 2018-01-17 NOTE — Telephone Encounter (Signed)
-----   Message from Verner Choleborah S Leonard, CNM sent at 01/15/2018 10:32 PM EST ----- Reviewed finding in right breast with mass noted in UOQ. Simple cyst noted. I want to recheck patient to make sure this is the area they noted, note is not clear. She has family history of breast cancer with mother and PGM. Please schedule

## 2018-01-17 NOTE — Telephone Encounter (Signed)
Left message to call regarding breast follow up with DL -eh

## 2018-01-17 NOTE — Telephone Encounter (Signed)
Spoke with patient- she is scheduled for breast follow up 01-26-18 -eh

## 2018-01-24 ENCOUNTER — Telehealth: Payer: Self-pay | Admitting: Certified Nurse Midwife

## 2018-01-24 NOTE — Telephone Encounter (Signed)
Patient said that she spoke with Consuella Loselaine recently and has some questions.

## 2018-01-24 NOTE — Telephone Encounter (Signed)
Patient is scheduled for a breast recheck on 01/26/2018 and would like clarification on what will be done at this appointment. Advised patient that she will have a physical breast exam to compare palpable exam to imaging findings. Patient states the ultrasound performed was of the whole breast. Feels the radiologist was very thorough and that this is not needed. Advised recheck is recommended by Leota Sauerseborah Leonard CNM. Patient would like to cancel this appointment. Does not wish to proceed. Appointment canceled.  Routing to provider for final review. Patient agreeable to disposition. Will close encounter.

## 2018-01-26 ENCOUNTER — Ambulatory Visit: Payer: Self-pay | Admitting: Certified Nurse Midwife

## 2018-01-31 DIAGNOSIS — L820 Inflamed seborrheic keratosis: Secondary | ICD-10-CM | POA: Diagnosis not present

## 2018-01-31 DIAGNOSIS — L821 Other seborrheic keratosis: Secondary | ICD-10-CM | POA: Diagnosis not present

## 2018-01-31 DIAGNOSIS — L570 Actinic keratosis: Secondary | ICD-10-CM | POA: Diagnosis not present

## 2018-01-31 DIAGNOSIS — L812 Freckles: Secondary | ICD-10-CM | POA: Diagnosis not present

## 2018-01-31 DIAGNOSIS — D2261 Melanocytic nevi of right upper limb, including shoulder: Secondary | ICD-10-CM | POA: Diagnosis not present

## 2018-01-31 DIAGNOSIS — D2262 Melanocytic nevi of left upper limb, including shoulder: Secondary | ICD-10-CM | POA: Diagnosis not present

## 2018-04-12 ENCOUNTER — Other Ambulatory Visit: Payer: BLUE CROSS/BLUE SHIELD

## 2018-04-18 DIAGNOSIS — J101 Influenza due to other identified influenza virus with other respiratory manifestations: Secondary | ICD-10-CM | POA: Diagnosis not present

## 2018-04-18 DIAGNOSIS — J029 Acute pharyngitis, unspecified: Secondary | ICD-10-CM | POA: Diagnosis not present

## 2018-04-19 ENCOUNTER — Other Ambulatory Visit: Payer: BLUE CROSS/BLUE SHIELD

## 2018-04-28 DIAGNOSIS — J1100 Influenza due to unidentified influenza virus with unspecified type of pneumonia: Secondary | ICD-10-CM | POA: Diagnosis not present

## 2018-07-23 DIAGNOSIS — M79671 Pain in right foot: Secondary | ICD-10-CM | POA: Diagnosis not present

## 2018-11-14 ENCOUNTER — Ambulatory Visit
Admission: RE | Admit: 2018-11-14 | Discharge: 2018-11-14 | Disposition: A | Discharge status: Home / Self Care | Payer: BLUE CROSS/BLUE SHIELD | Source: Ambulatory Visit | Attending: Certified Nurse Midwife | Admitting: Certified Nurse Midwife

## 2018-11-14 ENCOUNTER — Other Ambulatory Visit: Payer: Self-pay | Admitting: Certified Nurse Midwife

## 2018-11-14 DIAGNOSIS — Z1231 Encounter for screening mammogram for malignant neoplasm of breast: Secondary | ICD-10-CM

## 2018-11-16 ENCOUNTER — Other Ambulatory Visit: Payer: Self-pay | Admitting: Certified Nurse Midwife

## 2018-11-16 DIAGNOSIS — R928 Other abnormal and inconclusive findings on diagnostic imaging of breast: Secondary | ICD-10-CM

## 2018-11-21 ENCOUNTER — Ambulatory Visit
Admission: RE | Admit: 2018-11-21 | Discharge: 2018-11-21 | Disposition: A | Discharge status: Home / Self Care | Payer: BLUE CROSS/BLUE SHIELD | Source: Ambulatory Visit | Attending: Certified Nurse Midwife | Admitting: Certified Nurse Midwife

## 2018-11-21 DIAGNOSIS — N6002 Solitary cyst of left breast: Secondary | ICD-10-CM | POA: Diagnosis not present

## 2018-11-21 DIAGNOSIS — R928 Other abnormal and inconclusive findings on diagnostic imaging of breast: Secondary | ICD-10-CM

## 2018-11-21 DIAGNOSIS — R922 Inconclusive mammogram: Secondary | ICD-10-CM | POA: Diagnosis not present

## 2018-12-14 DIAGNOSIS — G5762 Lesion of plantar nerve, left lower limb: Secondary | ICD-10-CM | POA: Diagnosis not present

## 2019-01-25 ENCOUNTER — Encounter: Payer: Self-pay | Admitting: Certified Nurse Midwife

## 2019-01-25 ENCOUNTER — Other Ambulatory Visit: Payer: Self-pay

## 2019-01-25 ENCOUNTER — Ambulatory Visit: Payer: BLUE CROSS/BLUE SHIELD | Admitting: Certified Nurse Midwife

## 2019-01-25 VITALS — BP 118/72 | HR 68 | Resp 16 | Ht 66.25 in | Wt 142.0 lb

## 2019-01-25 DIAGNOSIS — N951 Menopausal and female climacteric states: Secondary | ICD-10-CM | POA: Diagnosis not present

## 2019-01-25 DIAGNOSIS — Z0000 Encounter for general adult medical examination without abnormal findings: Secondary | ICD-10-CM

## 2019-01-25 DIAGNOSIS — Z01419 Encounter for gynecological examination (general) (routine) without abnormal findings: Secondary | ICD-10-CM

## 2019-01-25 DIAGNOSIS — E559 Vitamin D deficiency, unspecified: Secondary | ICD-10-CM

## 2019-01-25 NOTE — Progress Notes (Signed)
53 y.o. [id number] Divorced  Caucasian Fe here for annual exam. Menopausal symptoms none. No vaginal bleeding or vaginal dryness. Sees Urgent care if needed. No partner change, engaged! Patient also see RN on staff at [facility], and treated for allergies. No health issues today. Desires screening labs.   No LMP recorded. Patient has had an ablation.          Sexually active: Yes.    The current method of family planning is tubal ligation.    Exercising: No.  exercise Smoker:  no  Review of Systems  Constitutional: Negative.   HENT: Negative.   Eyes: Negative.   Respiratory: Negative.   Cardiovascular: Negative.   Gastrointestinal: Negative.   Genitourinary: Negative.   Musculoskeletal: Negative.   Skin: Negative.   Neurological: Negative.   Endo/Heme/Allergies: Negative.   Psychiatric/Behavioral: Negative.     Health Maintenance: Pap:  01-10-2018 neg HPV HR neg History of Abnormal Pap: no MMG:  11/19 bilateral & left breast u/s birads 2:neg Self Breast exams: yes Colonoscopy:  2016 neg f/u 52yrs BMD:   Not done TDaP:  2016 Shingles: not done Pneumonia: not done Hep C and HIV: not done Labs: yes   reports that she has quit smoking. She has never used smokeless tobacco. She reports current alcohol use of about 4.0 - 5.0 standard drinks of alcohol per week. She reports that she does not use drugs.  No past medical history on file.  Past Surgical History:  Procedure Laterality Date  . ENDOMETRIAL ABLATION    . TUBAL LIGATION      No current outpatient medications on file.   No current facility-administered medications for this visit.     Family History  Problem Relation Age of Onset  . Breast cancer Mother   . Heart attack Father   . Heart attack Maternal Grandmother   . Breast cancer Paternal Grandmother   . Diabetes Paternal Grandmother   . Heart attack Paternal Grandfather   . Breast cancer Maternal Aunt     ROS:  Pertinent items are noted in HPI.   Otherwise, a comprehensive ROS was negative.  Exam:   There were no vitals taken for this visit.   Ht Readings from Last 3 Encounters:  01/10/18 5' 6.5" (1.689 m)  09/21/16 5\' 7"  (1.702 m)  09/09/16 5' 6.5" (1.689 m)    General appearance: alert, cooperative and appears stated age Head: Normocephalic, without obvious abnormality, atraumatic Neck: no adenopathy, supple, symmetrical, trachea midline and thyroid normal to inspection and palpation Lungs: clear to auscultation bilaterally Breasts: normal appearance, no masses or tenderness, No nipple retraction or dimpling, No nipple discharge or bleeding, No axillary or supraclavicular adenopathy Heart: regular rate and rhythm Abdomen: soft, non-tender; no masses,  no organomegaly Extremities: extremities normal, atraumatic, no cyanosis or edema Skin: Skin color, texture, turgor normal. No rashes or lesions Lymph nodes: Cervical, supraclavicular, and axillary nodes normal. No abnormal inguinal nodes palpated Neurologic: Grossly normal   Pelvic: External genitalia:  no lesions              Urethra:  normal appearing urethra with no masses, tenderness or lesions              Bartholin's and Skene's: normal                 Vagina: normal appearing vagina with normal color and discharge, no lesions              Cervix: no cervical motion  tenderness, no lesions and normal appearance              Pap taken: No. Bimanual Exam:  Uterus:  normal size, contour, position, consistency, mobility, non-tender and anteverted              Adnexa: normal adnexa and no mass, fullness, tenderness               Rectovaginal: Confirms               Anus:  normal sphincter tone, no lesions  Chaperone present: yes  A:  Well Woman with normal exam  Menopausal no HRT  Vaginal dryness  Screening labs  P:   Reviewed health and wellness pertinent to exam  Aware of need to advise if vaginal bleeding  Discussed OTC Olive or coconut oil for use twice weekly.  Will advise if no change.  Labs: CMP, Lipid panel, TSH, Vitamin D  Pap smear: no   counseled on breast self exam, mammography screening, feminine hygiene, menopause, adequate intake of calcium and vitamin D, diet and exercise  return annually or prn  An After Visit Summary was printed and given to the patient.

## 2019-01-25 NOTE — Patient Instructions (Signed)

## 2019-01-26 ENCOUNTER — Telehealth: Payer: Self-pay | Admitting: *Deleted

## 2019-01-26 ENCOUNTER — Telehealth: Payer: Self-pay

## 2019-01-26 ENCOUNTER — Other Ambulatory Visit: Payer: Self-pay | Admitting: Certified Nurse Midwife

## 2019-01-26 DIAGNOSIS — E559 Vitamin D deficiency, unspecified: Secondary | ICD-10-CM

## 2019-01-26 DIAGNOSIS — R899 Unspecified abnormal finding in specimens from other organs, systems and tissues: Secondary | ICD-10-CM

## 2019-01-26 LAB — COMPREHENSIVE METABOLIC PANEL
A/G RATIO: 2.1 (ref 1.2–2.2)
ALBUMIN: 4.5 g/dL (ref 3.8–4.9)
ALT: 58 IU/L — ABNORMAL HIGH (ref 0–32)
AST: 44 IU/L — ABNORMAL HIGH (ref 0–40)
Alkaline Phosphatase: 70 IU/L (ref 39–117)
BILIRUBIN TOTAL: 0.3 mg/dL (ref 0.0–1.2)
BUN / CREAT RATIO: 30 — AB (ref 9–23)
BUN: 23 mg/dL (ref 6–24)
CO2: 22 mmol/L (ref 20–29)
Calcium: 9.7 mg/dL (ref 8.7–10.2)
Chloride: 105 mmol/L (ref 96–106)
Creatinine, Ser: 0.76 mg/dL (ref 0.57–1.00)
GFR calc Af Amer: 103 mL/min/{1.73_m2} (ref >59)
GFR calc non Af Amer: 89 mL/min/{1.73_m2} (ref >59)
GLOBULIN, TOTAL: 2.1 g/dL (ref 1.5–4.5)
Glucose: 103 mg/dL — ABNORMAL HIGH (ref 65–99)
Potassium: 4.7 mmol/L (ref 3.5–5.2)
SODIUM: 141 mmol/L (ref 134–144)
Total Protein: 6.6 g/dL (ref 6.0–8.5)

## 2019-01-26 LAB — LIPID PANEL
CHOLESTEROL TOTAL: 221 mg/dL — AB (ref 100–199)
Chol/HDL Ratio: 2.9 ratio (ref 0.0–4.4)
HDL: 75 mg/dL (ref >39)
LDL Calculated: 134 mg/dL — ABNORMAL HIGH (ref 0–99)
TRIGLYCERIDES: 61 mg/dL (ref 0–149)
VLDL Cholesterol Cal: 12 mg/dL (ref 5–40)

## 2019-01-26 LAB — VITAMIN D 25 HYDROXY (VIT D DEFICIENCY, FRACTURES): Vit D, 25-Hydroxy: 18.8 ng/mL — ABNORMAL LOW (ref 30.0–100.0)

## 2019-01-26 LAB — TSH: TSH: 4.42 u[IU]/mL (ref 0.450–4.500)

## 2019-01-26 MED ORDER — VITAMIN D (ERGOCALCIFEROL) 1.25 MG (50000 UNIT) PO CAPS: 50000.0000 [IU] | ORAL_CAPSULE | ORAL | 0 refills | Status: AC

## 2019-01-26 NOTE — Telephone Encounter (Signed)
Notes recorded by Leda Min, RN on 01/26/2019 at 2:38 PM EST Left message to call Noreene Larsson, RN at [facility] [phone].   ------  Notes recorded by Verner Chol, CNM on 01/26/2019 at 8:09 AM EST Notify patient her Vitamin D is very low at 18.8 this will increase fatigue and decrease calcium absorption needed in menopause. Needs Rx 50,000 once every 7 days, recheck 3 months order placed TSH is normal Lipid panel shows elevated cholesterol at 221 previous 223, triglycerides 61, HDL 75, LDL is elevated at 134 previous 117, Need to work on fiber in diet with vegetables, fruit and whole grains, lean meat, fish Regular exercise 3-4 times weekly. Recheck at next aex, due to good HDL Kidney profile is normal, Glucose slightly elevated, watch carbohydrate intake, BUN ratio slightly elevated but BUN and creatinine are normal, can occur if dehydrated at when obtained LiIver AST, ALT are elevated stop all OTC tylenol, advil, aspirin and recheck in 2 weeks . Will do Hgb a1-c at that time and check BUN, so do not fast orders placed

## 2019-01-26 NOTE — Telephone Encounter (Signed)
Patient returned call to nurse Jill. °

## 2019-01-26 NOTE — Telephone Encounter (Signed)
Opened in error

## 2019-01-26 NOTE — Telephone Encounter (Signed)
Spoke with patient, advised as seen below per Leota Sauers, CNM. Lab appt scheduled for 2/18 at 8:40am and 5/8 at 8:45am. Vit D Rx to verified pharmacy, patient read back instructions. Patient verbalizes understanding and is agreeable.   Encounter closed.

## 2019-02-13 ENCOUNTER — Other Ambulatory Visit (INDEPENDENT_AMBULATORY_CARE_PROVIDER_SITE_OTHER): Payer: BLUE CROSS/BLUE SHIELD

## 2019-02-13 DIAGNOSIS — E559 Vitamin D deficiency, unspecified: Secondary | ICD-10-CM | POA: Diagnosis not present

## 2019-02-13 DIAGNOSIS — R899 Unspecified abnormal finding in specimens from other organs, systems and tissues: Secondary | ICD-10-CM

## 2019-02-14 LAB — AST: AST: 27 IU/L (ref 0–40)

## 2019-02-14 LAB — HEMOGLOBIN A1C
ESTIMATED AVERAGE GLUCOSE: 120 mg/dL
HEMOGLOBIN A1C: 5.8 % — AB (ref 4.8–5.6)

## 2019-02-14 LAB — ALT: ALT: 40 IU/L — AB (ref 0–32)

## 2019-02-14 LAB — VITAMIN D 25 HYDROXY (VIT D DEFICIENCY, FRACTURES): VIT D 25 HYDROXY: 39.2 ng/mL (ref 30.0–100.0)

## 2019-02-15 ENCOUNTER — Telehealth: Payer: Self-pay | Admitting: *Deleted

## 2019-02-15 NOTE — Telephone Encounter (Signed)
Notes recorded by Leda Min, RN on 02/15/2019 at 10:51 AM EST Left message to call Noreene Larsson, RN at [facility] [phone].

## 2019-02-15 NOTE — Telephone Encounter (Signed)
Encounter closed

## 2019-02-15 NOTE — Telephone Encounter (Signed)
Patient returning call to Jill. °

## 2019-02-15 NOTE — Telephone Encounter (Signed)
-----   Message from Verner Chol, CNM sent at 02/15/2019  7:45 AM EST ----- Notify patient her AST is normal now at 27 ALT is better but still elevated at 40 previous 58 and will need to be followed Hgb A1-C is elevated at 5.8 normal 4.8-5.6, this is prediabetes range and feel she needs evaluation and follow up with PCP, please refer if possible to [facility] Courts, if she needs help with this Vitamin D is improving form 18,8 to 39.2 she needs to take only one weekly make sure she is doing this. Unusual to go up this quick after 3 weeks? use

## 2019-02-15 NOTE — Telephone Encounter (Signed)
Continue current dosage 

## 2019-02-15 NOTE — Telephone Encounter (Signed)
Spoke with patient, advised as seen below per Leota Sauers, CNM. Patient declines assistance with PCP, states she has someone in mind, will return call to notify office and request labs once scheduled. Patient states she is taking one 50,000IU Vit D cap q7 days. Advised I will update Leota Sauers, CNM and return call if any changes to Vit D. Patient verbalizes understanding and is agreeable.   Leota Sauers, CNM -any changes to Vit D?

## 2019-04-12 ENCOUNTER — Other Ambulatory Visit: Payer: Self-pay | Admitting: Certified Nurse Midwife

## 2019-04-12 NOTE — Telephone Encounter (Signed)
Please contact the patient in follow up to the prescription request.  Patient's vitamin D level was normal with her last check.   I recommend she transition to over the counter vitamin D3, 600 - 800 IU daily.   Prescription vit D not needed.  Her vit D level can be checked yearly at her well woman visit with Sara Chu.

## 2019-04-12 NOTE — Telephone Encounter (Signed)
Medication refill request: vitamin D Last AEX:  01/25/19 DL Next AEX: 09/04/34 DL Last MMG (if hormonal medication request): 11/21/18 [place] Left BIRADS2:Benign. F/u screening 1 year  Refill authorized: 01/26/19 #[id number]/0R. Today please advise.

## 2019-04-16 NOTE — Telephone Encounter (Signed)
Left detailed message, name identified on voicemail, ok per dpr. Advised as seen below per Dr. Edward Jolly. Return call to office if any additional questions.  Encounter closed.

## 2019-05-04 ENCOUNTER — Other Ambulatory Visit: Payer: Self-pay

## 2019-08-01 IMAGING — US ULTRASOUND LEFT BREAST LIMITED
1 series · 5 of 5 positions shown · non-contrast
Comparison: Previous exam(s).

CLINICAL DATA: Patient recalled from screening for left breast
mass.

EXAM:
DIGITAL DIAGNOSTIC LEFT MAMMOGRAM WITH CAD AND TOMO
ULTRASOUND LEFT BREAST

[Series 1: ultrasound left breast limited · 0.06mm/px · 5 of 5 slices shown]
[im 1/5]
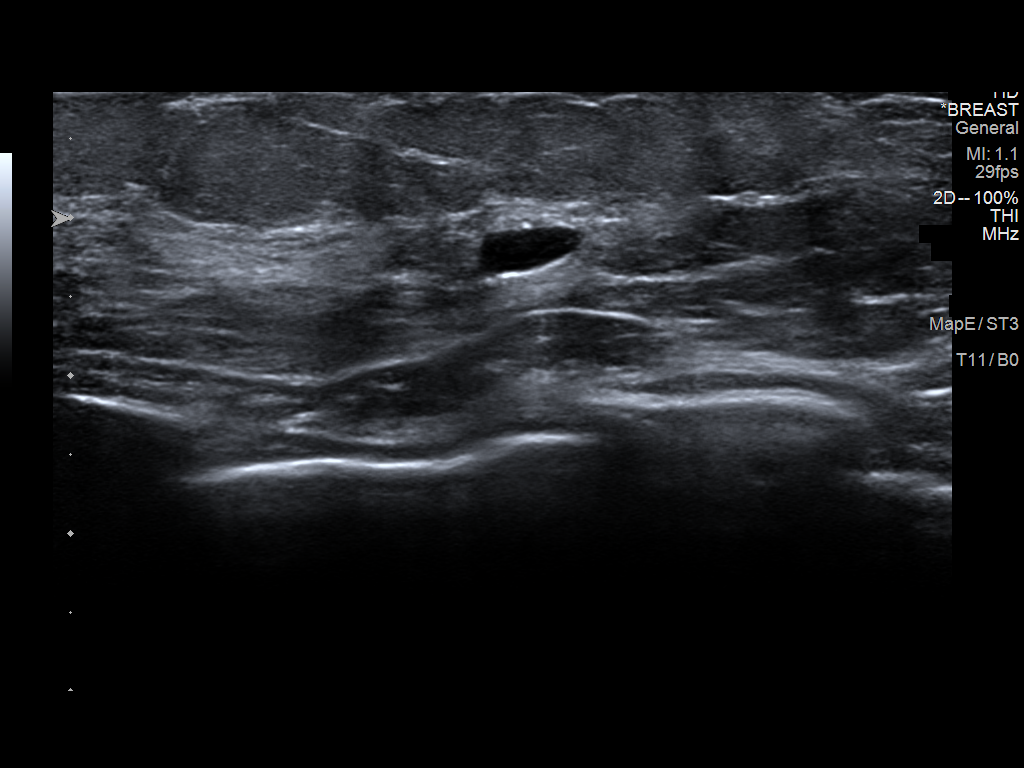
[im 2/5]
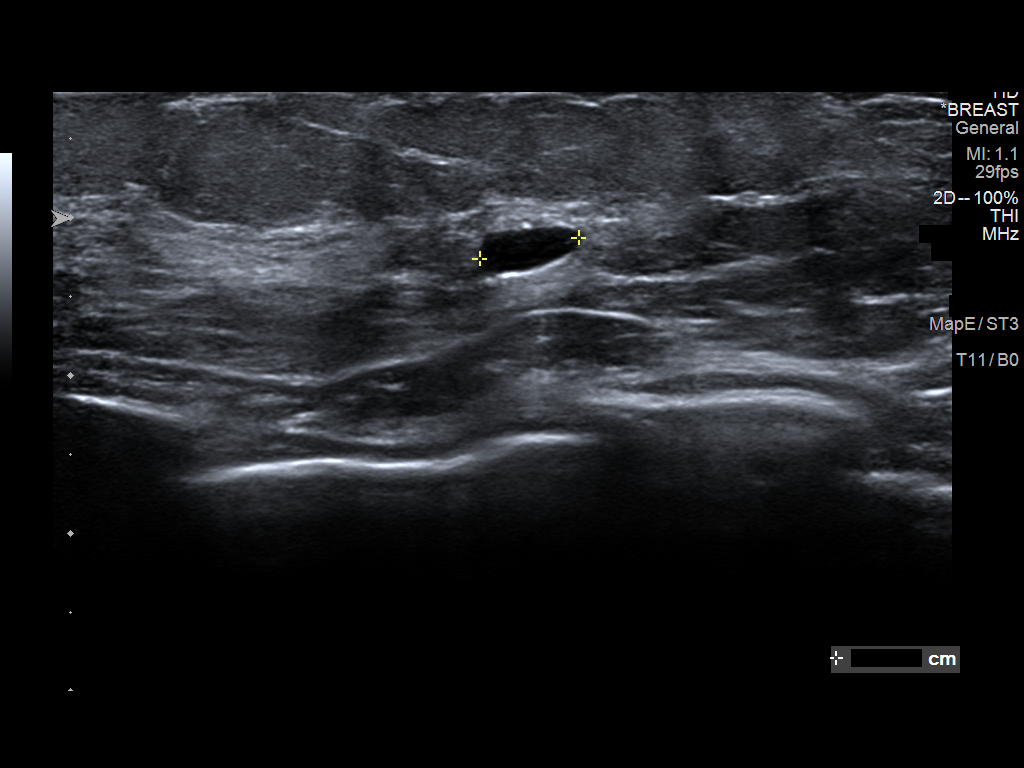
[im 3/5]
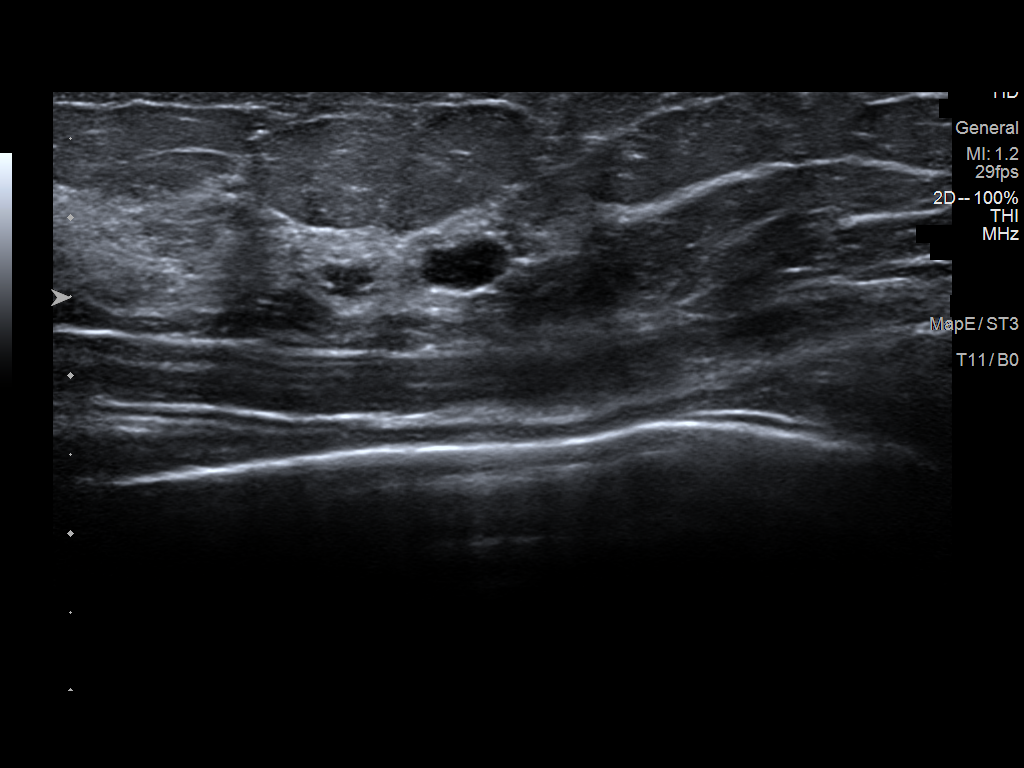
[im 4/5]
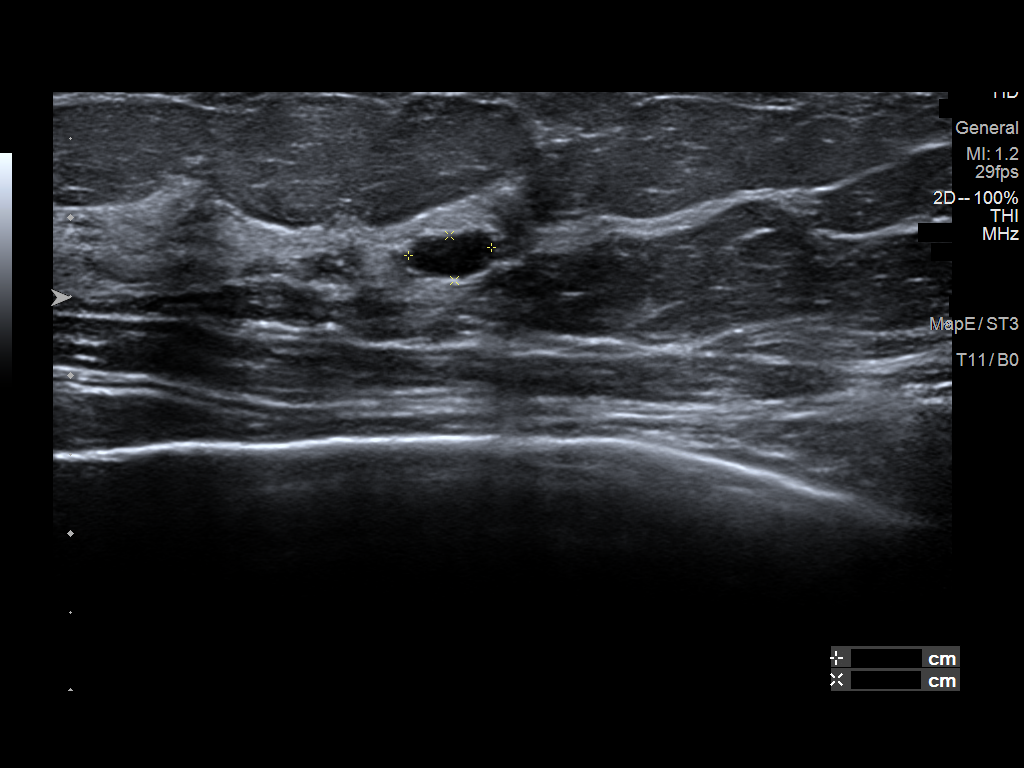
[im 5/5]
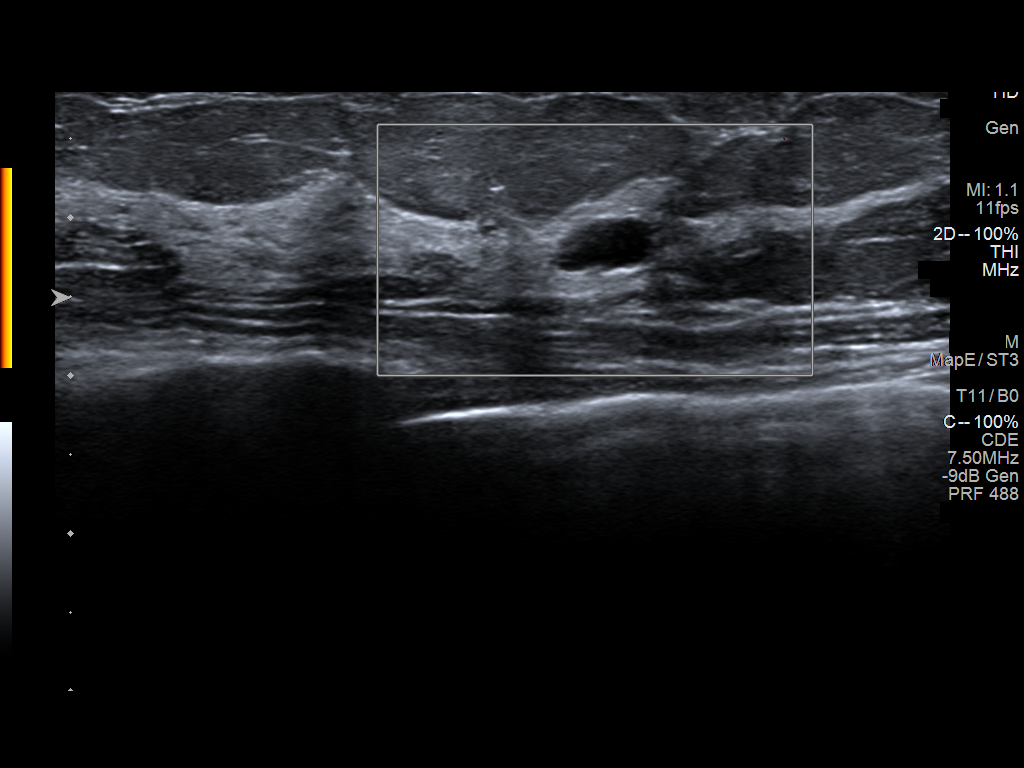

[5 of 5 positions shown; findings below may reference images not displayed]

ACR Breast Density Category c: The breast tissue is heterogeneously
dense, which may obscure small masses.
FINDINGS: Within the lower outer left breast posterior depth there is a
persistent oval circumscribed mass, further evaluated with
tomosynthesis images.

Mammographic images were processed with CAD.

On physical exam, I palpate no discrete mass within the lower outer
left breast.

Targeted ultrasound is performed, showing a 6 x 5 x 3 mm simple cyst
left breast 4 o'clock position 3 cm from the nipple.
IMPRESSION: Left breast simple cyst.

No mammographic evidence for malignancy.

RECOMMENDATION:
Screening mammogram in one year.(Code:G6-2-S6H)

I have discussed the findings and recommendations with the patient.
Results were also provided in writing at the conclusion of the
visit. If applicable, a reminder letter will be sent to the patient
regarding the next appointment.

BI-RADS CATEGORY  2: Benign.

## 2019-11-12 ENCOUNTER — Other Ambulatory Visit: Payer: Self-pay | Admitting: Certified Nurse Midwife

## 2019-11-12 DIAGNOSIS — Z1231 Encounter for screening mammogram for malignant neoplasm of breast: Secondary | ICD-10-CM

## 2019-11-17 ENCOUNTER — Other Ambulatory Visit: Payer: Self-pay

## 2019-11-17 ENCOUNTER — Emergency Department
Admission: EM | Admit: 2019-11-17 | Discharge: 2019-11-17 | Disposition: A | Discharge status: Home / Self Care | Payer: BC Managed Care – PPO | Source: Home / Self Care | Attending: Family Medicine | Admitting: Family Medicine

## 2019-11-17 ENCOUNTER — Encounter: Payer: Self-pay | Admitting: Emergency Medicine

## 2019-11-17 DIAGNOSIS — J069 Acute upper respiratory infection, unspecified: Secondary | ICD-10-CM

## 2019-11-17 NOTE — Discharge Instructions (Addendum)
Take plain guaifenesin (1247m extended release tabs such as Mucinex) twice daily, with plenty of water, for cough and congestion.  May add Pseudoephedrine (352m one or two every 4 to 6 hours) for sinus congestion.  Get adequate rest.   May use Afrin nasal spray (or generic oxymetazoline) each morning for about 5 days and then discontinue.  Also recommend using saline nasal spray several times daily and saline nasal irrigation (AYR is a common brand).  Use Flonase nasal spray each morning after using Afrin nasal spray and saline nasal irrigation. Try warm salt water gargles for sore throat.  Stop all antihistamines for now, and other non-prescription cough/cold preparations. May continue Aleve for headache, body aches, etc. May take Delsym Cough Suppressant at bedtime for nighttime cough.    If symptoms become significantly worse during the night or over the weekend, proceed to the local emergency room.   If COVID-19 test is positive, isolate yourself until the below conditions are met: 1)  At least 7 days since symptoms onset. AND 2)  > 72 hours after symptom resolution (absence of fever without the use of fever-reducing medicine, and improvement in respiratory symptoms.

## 2019-11-17 NOTE — ED Provider Notes (Signed)
[facility]    CSN: [id number] Arrival date & time: 11/17/19  1223      History   Chief Complaint Chief Complaint  Patient presents with  . Nasal Congestion  . Cough    HPI Chloe Brown is a 53 y.o. female.   Patient complains of four day history of typical cold-like symptoms developing over several days, including mild sore throat, sinus congestion, headache, and productive cough.  She had tightness in her chest initially, now resolved.  She denies changes in taste/smell.  Her daughter has had similar symptoms.  The history is provided by the patient.    History reviewed. No pertinent past medical history.  There are no active problems to display for this patient.   Past Surgical History:  Procedure Laterality Date  . ENDOMETRIAL ABLATION    . TUBAL LIGATION      OB History    Gravida  2   Para  2   Term  2   Preterm      AB      Living  2     SAB      TAB      Ectopic      Multiple      Live Births  2            Home Medications    Prior to Admission medications   Medication Sig Start Date End Date Taking? Authorizing Provider  Ascorbic Acid (VITAMIN C PO) Take by mouth.    [provider]  cetirizine (ZYRTEC) 10 MG tablet Take 10 mg by mouth daily.    [provider]  Vitamin D, Ergocalciferol, (DRISDOL) 1.25 MG (50000 UT) CAPS capsule Take 1 capsule (50,000 Units total) by mouth every 7 (seven) days. 01/26/19   Regina Eck, CNM    Family History Family History  Problem Relation Age of Onset  . Breast cancer Mother   . Heart attack Father   . Heart attack Maternal Grandmother   . Breast cancer Paternal Grandmother   . Diabetes Paternal Grandmother   . Heart attack Paternal Grandfather   . Breast cancer Maternal Aunt     Social History Social History   Tobacco Use  . Smoking status: Former Research scientist (life sciences)  . Smokeless tobacco: Never Used  Substance Use Topics  . Alcohol use: Not Currently  .  Drug use: No     Allergies   Patient has no known allergies.   Review of Systems Review of Systems + sore throat + cough No pleuritic pain No wheezing + nasal congestion + post-nasal drainage No sinus pain/pressure No itchy/red eyes No earache No hemoptysis No SOB No fever/chills No nausea No vomiting No abdominal pain No diarrhea No urinary symptoms No skin rash No fatigue + myalgias + headache Used OTC meds (Mucinex DM, Aleve) without relief   Physical Exam Triage Vital Signs ED Triage Vitals  Enc Vitals Group     BP 11/17/19 1435 (!) 148/84     Pulse Rate 11/17/19 1435 61     Resp 11/17/19 1435 16     Temp 11/17/19 1435 97.8 F (36.6 C)     Temp Source 11/17/19 1435 Oral     SpO2 11/17/19 1435 98 %     Weight 11/17/19 1436 155 lb (70.3 kg)     Height 11/17/19 1436 '5\' 7"'  (1.702 m)     Head Circumference --      Peak Flow --  Pain Score 11/17/19 1436 0     Pain Loc --      Pain Edu? --      Excl. in [place]? --    No data found.  Updated Vital Signs BP (!) 148/84 (BP Location: Right Arm)   Pulse 61   Temp 97.8 F (36.6 C) (Oral)   Resp 16   Ht '5\' 7"'  (1.702 m)   Wt 70.3 kg   SpO2 98%   BMI 24.28 kg/m   Visual Acuity Right Eye Distance:   Left Eye Distance:   Bilateral Distance:    Right Eye Near:   Left Eye Near:    Bilateral Near:     Physical Exam Nursing notes and Vital Signs reviewed. Appearance:  Patient appears stated age, and in no acute distress Eyes:  Pupils are equal, round, and reactive to light and accomodation.  Extraocular movement is intact.  Conjunctivae are not inflamed  Ears:  Canals normal.  Tympanic membranes normal.  Nose:  Mildly congested turbinates.  No sinus tenderness.   Pharynx:  Normal Neck:  Supple.  Enlarged posterior/lateral nodes are palpated bilaterally, tender to palpation on the left.   Lungs:  Clear to auscultation.  Breath sounds are equal.  Moving air well. Heart:  Regular rate and rhythm without  murmurs, rubs, or gallops.  Abdomen:  Nontender without masses or hepatosplenomegaly.  Bowel sounds are present.  No CVA or flank tenderness.  Extremities:  No edema.  Skin:  No rash present.    UC Treatments / Results  Labs (all labs ordered are listed, but only abnormal results are displayed) Labs Reviewed - No data to display  EKG   Radiology No results found.  Procedures Procedures (including critical care time)  Medications Ordered in UC Medications - No data to display  Initial Impression / Assessment and Plan / UC Course  I have reviewed the triage vital signs and the nursing notes.  Pertinent labs & imaging results that were available during my care of the patient were reviewed by me and considered in my medical decision making (see chart for details).     There is no evidence of bacterial infection today.  Treat symptomatically for now  COVID19 send out.  Final Clinical Impressions(s) / UC Diagnoses   Final diagnoses:  Viral URI with cough     Discharge Instructions     Take plain guaifenesin (1243m extended release tabs such as Mucinex) twice daily, with plenty of water, for cough and congestion.  May add Pseudoephedrine (39m one or two every 4 to 6 hours) for sinus congestion.  Get adequate rest.   May use Afrin nasal spray (or generic oxymetazoline) each morning for about 5 days and then discontinue.  Also recommend using saline nasal spray several times daily and saline nasal irrigation (AYR is a common brand).  Use Flonase nasal spray each morning after using Afrin nasal spray and saline nasal irrigation. Try warm salt water gargles for sore throat.  Stop all antihistamines for now, and other non-prescription cough/cold preparations. May continue Aleve for headache, body aches, etc. May take Delsym Cough Suppressant at bedtime for nighttime cough.    If symptoms become significantly worse during the night or over the weekend, proceed to the local  emergency room.   If COVID-19 test is positive, isolate yourself until the below conditions are met: 1)  At least 7 days since symptoms onset. AND 2)  > 72 hours after symptom resolution (absence of fever without  the use of fever-reducing medicine, and improvement in respiratory symptoms.        ED Prescriptions    None        Kandra Nicolas, MD 11/19/19 [facility] Deeds

## 2019-11-17 NOTE — ED Triage Notes (Signed)
Patient states she was around her daughter 7 days ago and she had a cough; 4 days ago patient developed cough and congestion; denies fever and does not feel bad. She had influenza vacc this season. She has no known covid exposure and has not travelled past 4 weeks.

## 2020-01-08 ENCOUNTER — Ambulatory Visit
Admission: RE | Admit: 2020-01-08 | Discharge: 2020-01-08 | Disposition: A | Discharge status: Home / Self Care | Payer: 59 | Source: Ambulatory Visit | Attending: Certified Nurse Midwife | Admitting: Certified Nurse Midwife

## 2020-01-08 ENCOUNTER — Other Ambulatory Visit: Payer: Self-pay

## 2020-01-08 DIAGNOSIS — Z1231 Encounter for screening mammogram for malignant neoplasm of breast: Secondary | ICD-10-CM

## 2020-01-30 ENCOUNTER — Ambulatory Visit: Payer: BLUE CROSS/BLUE SHIELD | Admitting: Certified Nurse Midwife

## 2020-03-17 ENCOUNTER — Encounter: Payer: Self-pay | Admitting: Certified Nurse Midwife

## 2021-01-09 ENCOUNTER — Other Ambulatory Visit: Payer: Self-pay | Admitting: Family Medicine

## 2021-01-09 ENCOUNTER — Other Ambulatory Visit: Payer: Self-pay | Admitting: Certified Nurse Midwife

## 2021-01-09 DIAGNOSIS — Z1231 Encounter for screening mammogram for malignant neoplasm of breast: Secondary | ICD-10-CM

## 2021-01-14 ENCOUNTER — Ambulatory Visit: Payer: 59

## 2021-02-25 ENCOUNTER — Other Ambulatory Visit: Payer: Self-pay

## 2021-02-25 ENCOUNTER — Ambulatory Visit
Admission: RE | Admit: 2021-02-25 | Discharge: 2021-02-25 | Disposition: A | Discharge status: Home / Self Care | Payer: No Typology Code available for payment source | Source: Ambulatory Visit | Attending: Family Medicine | Admitting: Family Medicine

## 2021-02-25 DIAGNOSIS — Z1231 Encounter for screening mammogram for malignant neoplasm of breast: Secondary | ICD-10-CM

## 2022-02-02 ENCOUNTER — Other Ambulatory Visit: Payer: Self-pay | Admitting: Family Medicine

## 2022-02-02 DIAGNOSIS — Z1231 Encounter for screening mammogram for malignant neoplasm of breast: Secondary | ICD-10-CM

## 2022-03-02 ENCOUNTER — Ambulatory Visit
Admission: RE | Admit: 2022-03-02 | Discharge: 2022-03-02 | Disposition: A | Discharge status: Home / Self Care | Payer: No Typology Code available for payment source | Source: Ambulatory Visit | Attending: Family Medicine | Admitting: Family Medicine

## 2022-03-02 ENCOUNTER — Other Ambulatory Visit: Payer: Self-pay

## 2022-03-02 DIAGNOSIS — Z1231 Encounter for screening mammogram for malignant neoplasm of breast: Secondary | ICD-10-CM

## 2022-11-10 IMAGING — MG MM DIGITAL SCREENING BILAT W/ TOMO AND CAD
8 series · 9 of 24 positions shown · non-contrast
Comparison: Previous exam(s).

CLINICAL DATA: Screening.

EXAM:
DIGITAL SCREENING BILATERAL MAMMOGRAM WITH TOMOSYNTHESIS AND CAD
TECHNIQUE: Bilateral screening digital craniocaudal and mediolateral oblique
mammograms were obtained. Bilateral screening digital breast
tomosynthesis was performed. The images were evaluated with
computer-aided detection.

[L CC synth-2D]
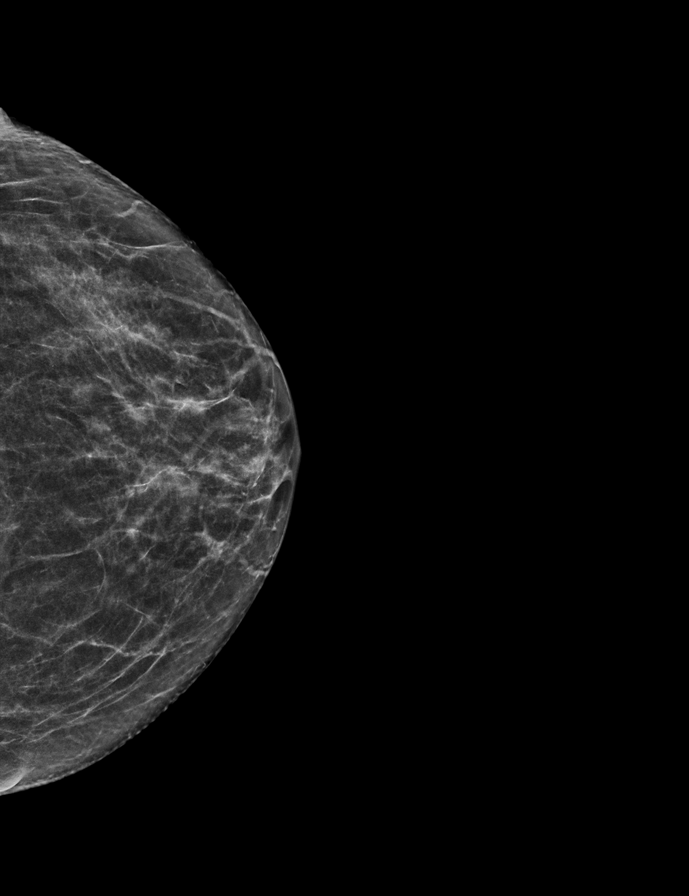

[L MLO synth-2D]
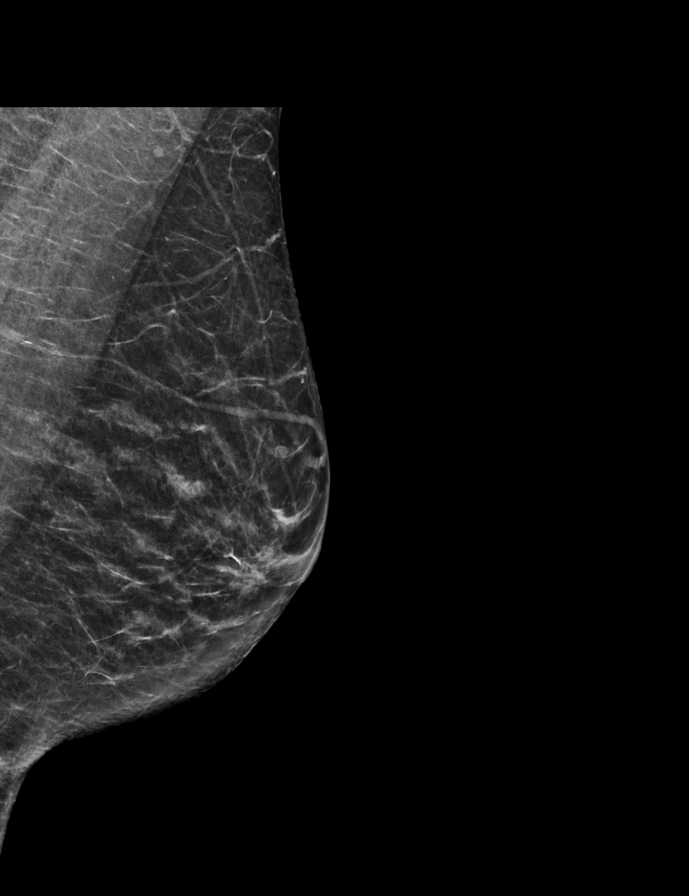

[R MLO synth-2D]
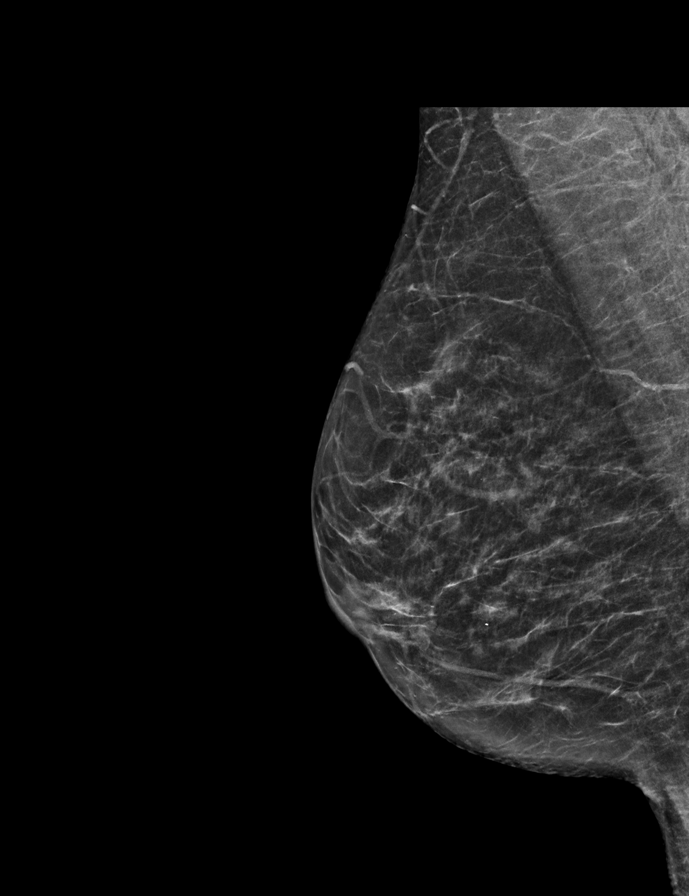

[R CC synth-2D]
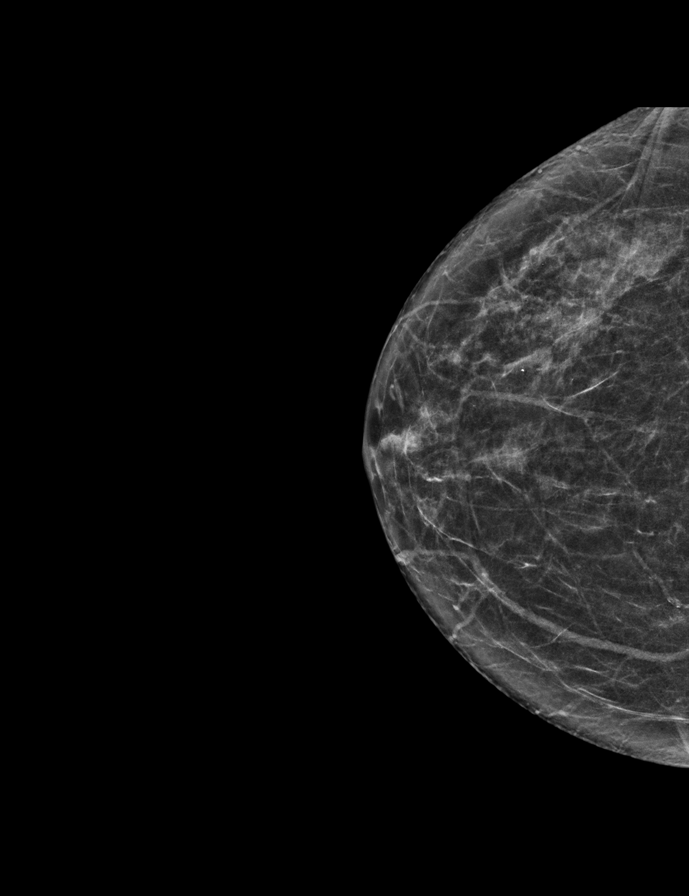

[R MLO tomo · 2 of 58 frames shown]
[frame 19/58]
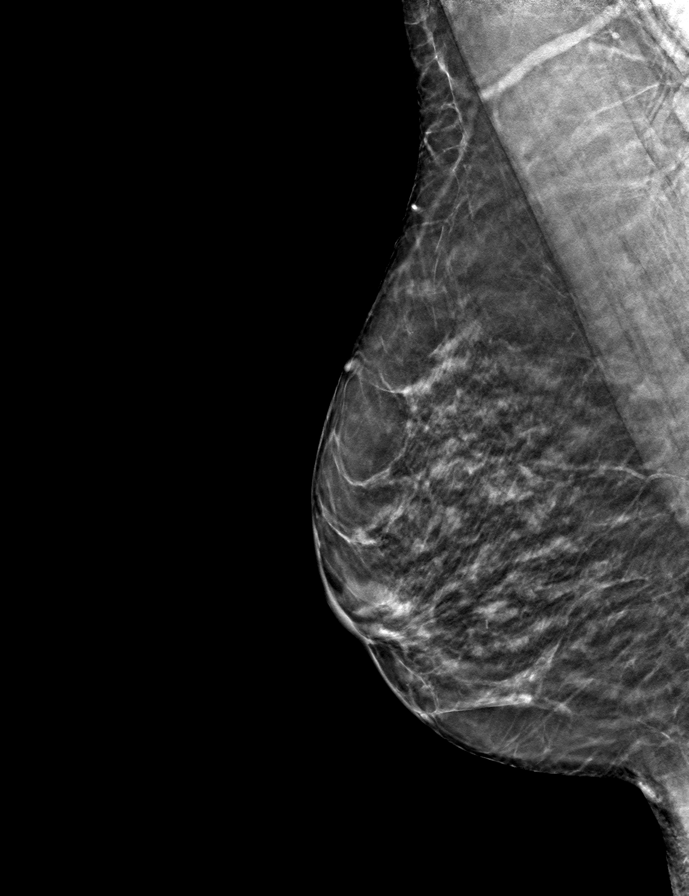
[frame 29/58]
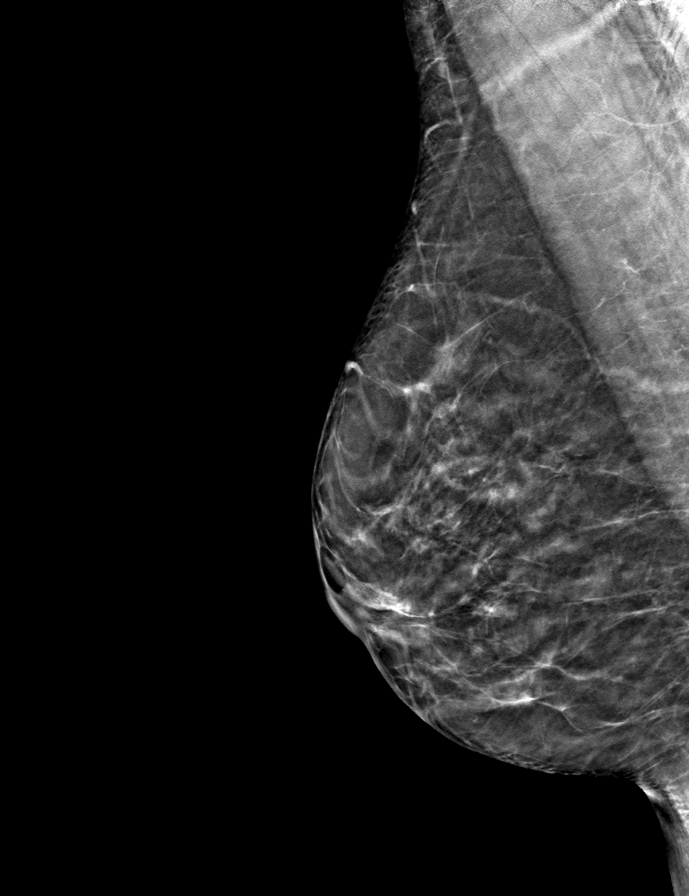

[R CC tomo · tomo slice 29/57.0]
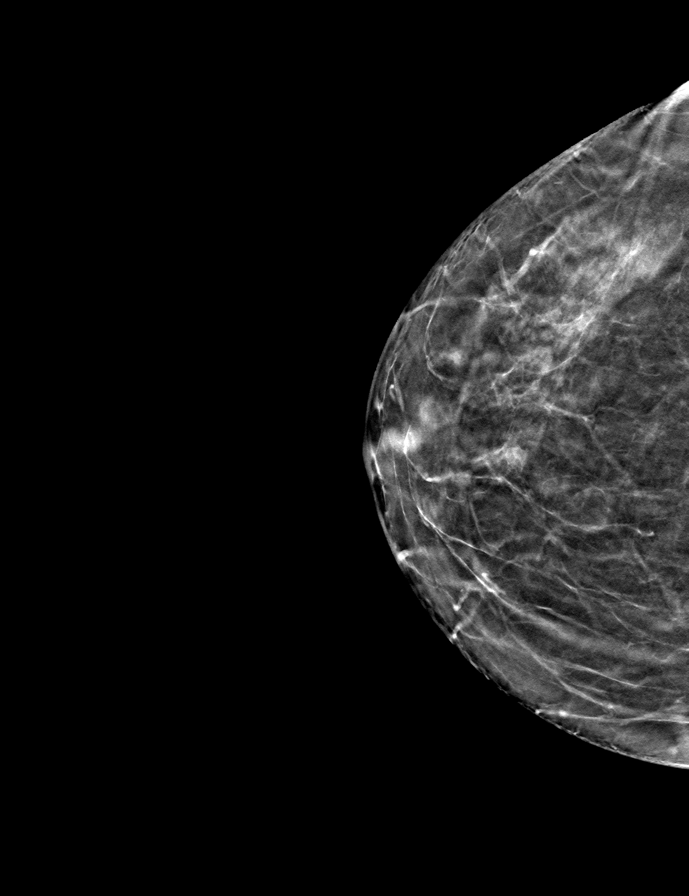

[L CC tomo · tomo slice 27/52.0]
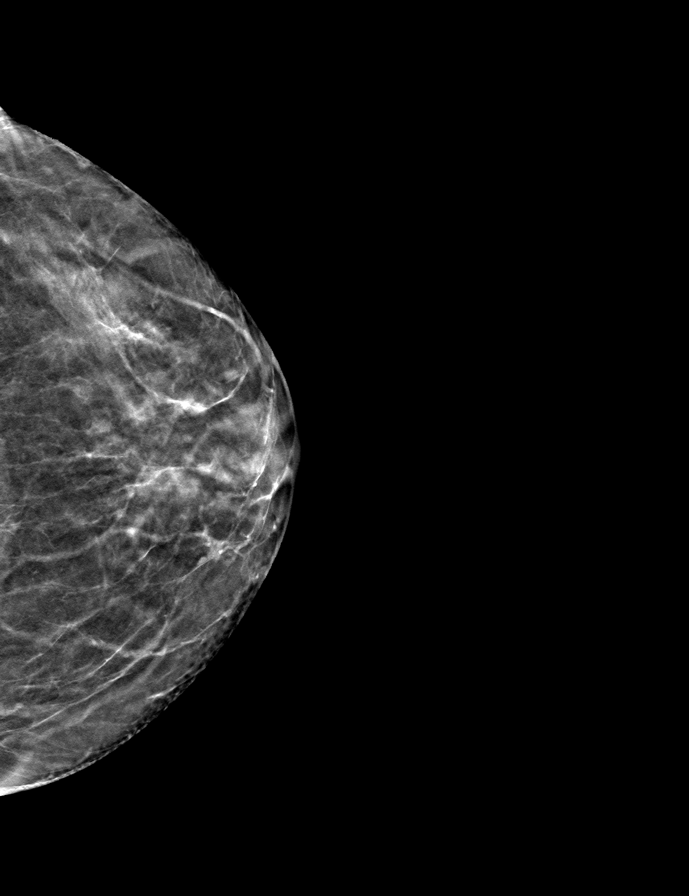

[L MLO tomo · tomo slice 28/55.0]
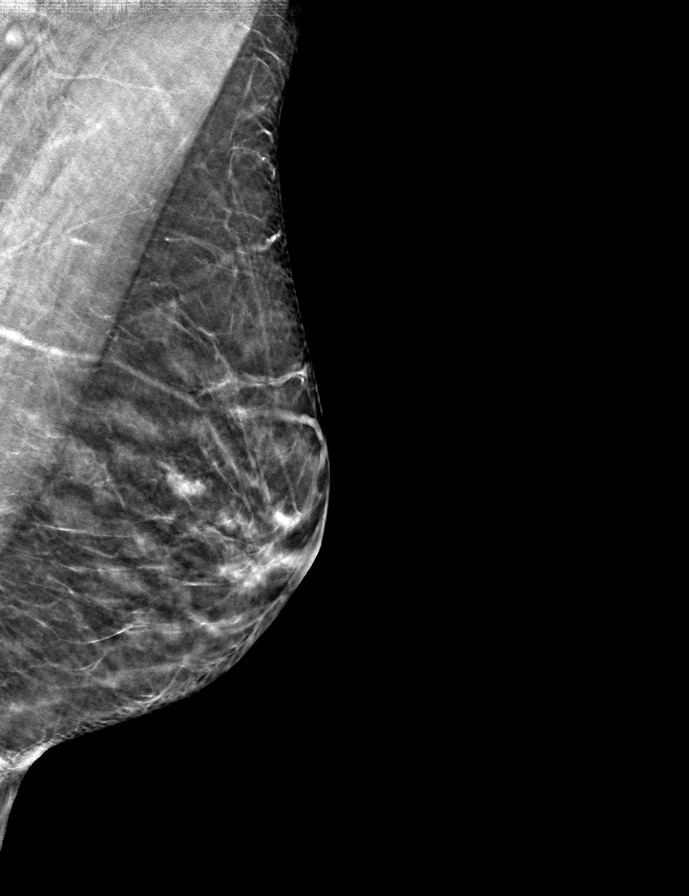

[9 of 24 positions shown; findings below may reference images not displayed]

ACR Breast Density Category b: There are scattered areas of
fibroglandular density.
FINDINGS: There are no findings suspicious for malignancy.
IMPRESSION: No mammographic evidence of malignancy. A result letter of this
screening mammogram will be mailed directly to the patient.

RECOMMENDATION:
Screening mammogram in one year. (Code:51-O-LD2)

BI-RADS CATEGORY  1: Negative.
